# Patient Record
Sex: Female | Born: 1970 | Race: White | Hispanic: No | Smoking: Never smoker
Health system: Southern US, Community
[De-identification: ages and names within clinical notes are randomized; demographics above are authoritative.]

## PROBLEM LIST (undated history)

## (undated) DIAGNOSIS — T7840XA Allergy, unspecified, initial encounter: Secondary | ICD-10-CM

## (undated) DIAGNOSIS — G43109 Migraine with aura, not intractable, without status migrainosus: Secondary | ICD-10-CM

## (undated) DIAGNOSIS — K219 Gastro-esophageal reflux disease without esophagitis: Secondary | ICD-10-CM

## (undated) DIAGNOSIS — E7800 Pure hypercholesterolemia, unspecified: Principal | ICD-10-CM

## (undated) DIAGNOSIS — Z8659 Personal history of other mental and behavioral disorders: Secondary | ICD-10-CM

## (undated) HISTORY — DX: Pure hypercholesterolemia, unspecified: E78.00

## (undated) HISTORY — DX: Migraine with aura, not intractable, without status migrainosus: G43.109

## (undated) HISTORY — PX: WISDOM TOOTH EXTRACTION: SHX21

## (undated) HISTORY — DX: Allergy, unspecified, initial encounter: T78.40XA

## (undated) HISTORY — DX: Personal history of other mental and behavioral disorders: Z86.59

## (undated) HISTORY — DX: Gastro-esophageal reflux disease without esophagitis: K21.9

---

## 1998-07-11 ENCOUNTER — Emergency Department (HOSPITAL_COMMUNITY): Admission: EM | Admit: 1998-07-11 | Discharge: 1998-07-11 | Payer: Self-pay | Admitting: Emergency Medicine

## 1998-07-11 ENCOUNTER — Encounter: Payer: Self-pay | Admitting: Emergency Medicine

## 2001-05-30 ENCOUNTER — Ambulatory Visit (HOSPITAL_COMMUNITY): Admission: RE | Admit: 2001-05-30 | Discharge: 2001-05-30 | Payer: Self-pay | Admitting: Obstetrics and Gynecology

## 2001-05-30 ENCOUNTER — Encounter: Payer: Self-pay | Admitting: Obstetrics and Gynecology

## 2001-06-14 HISTORY — PX: TUBAL LIGATION: SHX77

## 2001-08-21 ENCOUNTER — Inpatient Hospital Stay (HOSPITAL_COMMUNITY): Admission: AD | Admit: 2001-08-21 | Discharge: 2001-08-23 | Payer: Self-pay | Admitting: Obstetrics and Gynecology

## 2001-09-08 ENCOUNTER — Ambulatory Visit (HOSPITAL_COMMUNITY): Admission: RE | Admit: 2001-09-08 | Discharge: 2001-09-08 | Payer: Self-pay | Admitting: Internal Medicine

## 2001-09-22 ENCOUNTER — Other Ambulatory Visit: Admission: RE | Admit: 2001-09-22 | Discharge: 2001-09-22 | Payer: Self-pay | Admitting: Obstetrics and Gynecology

## 2004-03-06 ENCOUNTER — Other Ambulatory Visit: Admission: RE | Admit: 2004-03-06 | Discharge: 2004-03-06 | Payer: Self-pay | Admitting: Obstetrics and Gynecology

## 2004-05-19 ENCOUNTER — Ambulatory Visit: Payer: Self-pay | Admitting: Family Medicine

## 2005-04-12 ENCOUNTER — Ambulatory Visit: Payer: Self-pay | Admitting: Internal Medicine

## 2005-07-12 ENCOUNTER — Ambulatory Visit: Payer: Self-pay | Admitting: Internal Medicine

## 2006-03-07 ENCOUNTER — Ambulatory Visit: Payer: Self-pay | Admitting: Internal Medicine

## 2006-05-24 ENCOUNTER — Ambulatory Visit: Payer: Self-pay | Admitting: Internal Medicine

## 2007-01-27 ENCOUNTER — Ambulatory Visit: Payer: Self-pay | Admitting: Internal Medicine

## 2007-10-25 ENCOUNTER — Ambulatory Visit: Payer: Self-pay | Admitting: Internal Medicine

## 2007-10-25 DIAGNOSIS — Z8659 Personal history of other mental and behavioral disorders: Secondary | ICD-10-CM

## 2007-10-25 HISTORY — DX: Personal history of other mental and behavioral disorders: Z86.59

## 2007-10-26 LAB — CONVERTED CEMR LAB
Cholesterol: 189 mg/dL (ref 0–200)
HDL: 47.8 mg/dL (ref >39.0)
LDL Cholesterol: 131 mg/dL — ABNORMAL HIGH (ref 0–99)
Total CHOL/HDL Ratio: 4
Triglycerides: 52 mg/dL (ref 0–149)
VLDL: 10 mg/dL (ref 0–40)

## 2007-12-05 ENCOUNTER — Ambulatory Visit: Payer: Self-pay | Admitting: Internal Medicine

## 2008-08-14 ENCOUNTER — Emergency Department (HOSPITAL_COMMUNITY): Admission: EM | Admit: 2008-08-14 | Discharge: 2008-08-14 | Payer: Self-pay | Admitting: Emergency Medicine

## 2008-08-20 ENCOUNTER — Ambulatory Visit: Payer: Self-pay | Admitting: Internal Medicine

## 2009-02-24 ENCOUNTER — Ambulatory Visit: Payer: Self-pay | Admitting: Internal Medicine

## 2009-02-24 DIAGNOSIS — N390 Urinary tract infection, site not specified: Secondary | ICD-10-CM

## 2009-02-24 LAB — CONVERTED CEMR LAB
Bilirubin Urine: NEGATIVE
Blood in Urine, dipstick: NEGATIVE
Glucose, Urine, Semiquant: NEGATIVE
Nitrite: NEGATIVE
Protein, U semiquant: NEGATIVE
Specific Gravity, Urine: 1.025
Urobilinogen, UA: 0.2
pH: 5.5

## 2009-04-24 ENCOUNTER — Ambulatory Visit: Payer: Self-pay | Admitting: Family Medicine

## 2009-04-24 DIAGNOSIS — J069 Acute upper respiratory infection, unspecified: Secondary | ICD-10-CM | POA: Insufficient documentation

## 2009-04-29 ENCOUNTER — Telehealth: Payer: Self-pay | Admitting: Internal Medicine

## 2010-07-12 LAB — CONVERTED CEMR LAB
CK-MB: 0.9 ng/mL (ref 0.3–4.0)
Total CK: 91 units/L (ref 7–177)
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
## 2010-10-30 NOTE — Op Note (Signed)
[facility]  Patient:    Shelly Goodwin, Shelly Goodwin Visit Number: [id number] MRN: [id number]          Service Type: OBS Location: 910A 9116 01 Attending Physician:  Osborn Coho Dictated by:   Devoria Albe Edward Jolly, M.D. Proc. Date: 08/21/01 Admit Date:  08/21/2001                             Operative Report  PREOPERATIVE DIAGNOSES:       1. Intrauterine pregnancy 41+5 weeks.                               2. Nonreassuring fetal assessment.  POSTOPERATIVE DIAGNOSES:      1. Intrauterine gestation at 41+5 weeks.                               2. Nonreassuring fetal assessment.  PROCEDURE:                    Vacuum assisted vaginal delivery with repair of perineal and vulvar lacerations.  ANESTHESIA:                   Epidural, local with 1% lidocaine.  ESTIMATED BLOOD LOSS:         400 cc.  COMPLICATIONS:                None.  INDICATIONS FOR PROCEDURE:    The patient was a 40 year old gravida 1, para 10 Caucasian female admitted at 41+[redacted] weeks gestation on August 21, 2001 with a complaint of leakage of yellow stained fluid per vagina at 4:30 the morning of admission.  The patient also noted contractions.  When the patient arrived in maternity admissions the cervix was 3 cm dilated and the patient was found to be contracting every five to 10 minutes.  The fetal heart rate tracing was reactive and reassuring.  The patient was admitted and she was sent to labor and delivery where she was reexamined.  The cervix was found to be 4 cm dilated and 100% effaced with the vertex at the -1 station.  A four bag was noted and artificial rupture of membranes was performed and the fluid was noted to be clear and very minimally yellow tinged.  The patient continued to contract and progress throughout the active phase of labor with a normal labor curve.  She did receive an epidural for anesthesia and achieved complete cervical dilatation at 1400.  With pushing the fetal  heart rate tracing documented recurrent late decelerations with rebound tachycardia.  The fetus then went on to have a prolonged deceleration to approximately 80-90 which lasted three minutes.  A recommendation was made at this time to proceed with a vacuum assisted vaginal delivery after the risks and benefits were reviewed.  FINDINGS:                     A viable female was delivered at 1455 with Apgars of 8 at one minute and 9 at five minutes.  Terminal meconium and blood stained amniotic fluid were appreciated.  There was a partial third degree laceration of the perineum and a left labia majora laceration.  The cervix and vagina were without lacerations.  The placenta had a normal insertion of a three vessel cord and was not  meconium stained.  The cord pH was noted to be 7.23.  PROCEDURE:                    The patient was examined and the vertex was found to be in the occiput anterior position at the 3+ station.  The Foley catheter had been just removed a few minutes prior.  The MityVac was placed over the vertex and with one contraction the newborn was delivered without difficulty.  An attempt was made to do DeLee suctioning on the nares and mouth on the perineum.  The remainder of the infant was delivered without difficulty and the cord was doubly clamped and cut and carried over to the awaiting pediatricians.  Cord gas and cord blood were obtained and the placenta delivered spontaneously.  A repair of the perineal laceration was performed with 0 and 2-0 Vicryl after injection with 1% lidocaine.  The left labial laceration was bleeding and was therefore repaired with interrupted sutures of 3-0 Vicryl, again after injection of local 1% lidocaine.  The patient did receive Pitocin 20 units intravenously after delivery of the placenta.  There were no complications of the procedure.  The baby and the mother tolerated the procedure well.  All needle, instrument, and sponge counts were  correct. Dictated by:   Devoria Albe Edward Jolly, M.D. Attending Physician:  Osborn Coho DD:  08/21/01 TD:  08/22/01 Job: [id number]

## 2010-10-30 NOTE — Discharge Summary (Signed)
[facility]  Patient:    Shelly Goodwin, DIFATTA Visit Number: [id number] MRN: [id number]          Service Type: OBS Location: 910A 9116 01 Attending Physician:  Osborn Coho Dictated by:   Gerrit Friends. Aldona Bar, M.D. Admit Date:  08/21/2001 Discharge Date: 08/23/2001                             Discharge Summary  DISCHARGE DIAGNOSES:          1. Term pregnancy, delivered 7-pound 10-ounce                                  female infant with Apgars 8 and 9.                               2. Blood type O positive.  PROCEDURES:                   1. Vacuum extraction assisted delivery.                               2. Repair of second-degree tears.  SUMMARY:                      This 40 year old primigravida was admitted for induction because of postdates.  On the morning of August 21, 2001 she was 4 cm dilated with a vertex at -1 station.  Amniotomy was carried out with reduction of yellow-tinged fluid.  She had fairly good progression of her labor and with her second stage developed some late decelerations with some rebound tachycardia, because of this the delivery was assisted with the aid of a vacuum extractor.  She delivered a viable female infant with Apgars of 8 and 9 over a second-degree tear which was repaired without difficulty.  Her postpartum course was subsequently totally benign.  Discharge hemoglobin 11.2, white count 15,800, platelet count 129,000.  On the morning of August 23, 2001 she was ambulating well, tolerating a regular diet well, having normal bowel and bladder function and was afebrile, her breast-feeding was going well and she was desirous of discharge.  DISCHARGE MEDICATIONS:        Discharge medications include Motrin 400-600 mg every 4-6 hours as needed for discomfort, and she will continue on her prenatal vitamins - one a day.  FOLLOWUP:                     She will return to the office for followup in approximately 4 weeks  time.  DISCHARGE INSTRUCTIONS:       She was given a detailed instruction sheet at the time of discharge and understood all instructions well.                                As her rubella status was nonimmune, she will receive her rubella vaccine prior to discharge.  CONDITION ON DISCHARGE:       Improved. Dictated by:   Gerrit Friends. Aldona Bar, M.D. Attending Physician:  Osborn Coho DD:  08/23/01 TD:  08/25/01 Job: [id number]

## 2011-11-15 ENCOUNTER — Other Ambulatory Visit: Payer: Self-pay | Admitting: Obstetrics and Gynecology

## 2011-11-15 DIAGNOSIS — R928 Other abnormal and inconclusive findings on diagnostic imaging of breast: Secondary | ICD-10-CM

## 2011-11-19 ENCOUNTER — Ambulatory Visit
Admission: RE | Admit: 2011-11-19 | Discharge: 2011-11-19 | Disposition: A | Discharge status: Home / Self Care | Payer: 59 | Source: Ambulatory Visit | Attending: Obstetrics and Gynecology | Admitting: Obstetrics and Gynecology

## 2011-11-19 DIAGNOSIS — R928 Other abnormal and inconclusive findings on diagnostic imaging of breast: Secondary | ICD-10-CM

## 2012-12-14 ENCOUNTER — Ambulatory Visit: Payer: 59 | Admitting: Internal Medicine

## 2013-01-29 ENCOUNTER — Encounter: Payer: Self-pay | Admitting: Family

## 2013-01-29 ENCOUNTER — Ambulatory Visit (INDEPENDENT_AMBULATORY_CARE_PROVIDER_SITE_OTHER): Payer: 59 | Admitting: Family

## 2013-01-29 VITALS — BP 120/78 | HR 77 | Ht 64.75 in | Wt 155.0 lb

## 2013-01-29 DIAGNOSIS — Z79899 Other long term (current) drug therapy: Secondary | ICD-10-CM

## 2013-01-29 DIAGNOSIS — G43109 Migraine with aura, not intractable, without status migrainosus: Secondary | ICD-10-CM

## 2013-01-29 DIAGNOSIS — Z0000 Encounter for general adult medical examination without abnormal findings: Secondary | ICD-10-CM

## 2013-01-29 LAB — CBC WITH DIFFERENTIAL/PLATELET
Basophils Relative: 0.5 % (ref 0.0–3.0)
Eosinophils Relative: 1.8 % (ref 0.0–5.0)
HCT: 39.7 % (ref 36.0–46.0)
Lymphs Abs: 2.2 10*3/uL (ref 0.7–4.0)
MCHC: 34.4 g/dL (ref 30.0–36.0)
MCV: 92.4 fl (ref 78.0–100.0)
Monocytes Absolute: 0.5 10*3/uL (ref 0.1–1.0)
Neutro Abs: 4.3 10*3/uL (ref 1.4–7.7)
RBC: 4.3 Mil/uL (ref 3.87–5.11)
WBC: 7.2 10*3/uL (ref 4.5–10.5)

## 2013-01-29 LAB — LIPID PANEL
Cholesterol: 201 mg/dL — ABNORMAL HIGH (ref 0–200)
HDL: 49.5 mg/dL (ref >39.00)
Triglycerides: 45 mg/dL (ref 0.0–149.0)

## 2013-01-29 LAB — COMPREHENSIVE METABOLIC PANEL
AST: 14 U/L (ref 0–37)
Alkaline Phosphatase: 46 U/L (ref 39–117)
BUN: 10 mg/dL (ref 6–23)
Glucose, Bld: 82 mg/dL (ref 70–99)
Sodium: 137 mEq/L (ref 135–145)
Total Bilirubin: 0.7 mg/dL (ref 0.3–1.2)
Total Protein: 7.6 g/dL (ref 6.0–8.3)

## 2013-01-29 MED ORDER — AMITRIPTYLINE HCL 10 MG PO TABS: 10.0000 mg | ORAL_TABLET | Freq: Every day | ORAL | Status: DC

## 2013-01-29 NOTE — Patient Instructions (Addendum)
Migraine Headache A migraine headache is an intense, throbbing pain on one or both sides of your head. A migraine can last for 30 minutes to several hours. CAUSES  The exact cause of a migraine headache is not always known. However, a migraine may be caused when nerves in the brain become irritated and release chemicals that cause inflammation. This causes pain. SYMPTOMS  Pain on one or both sides of your head.  Pulsating or throbbing pain.  Severe pain that prevents daily activities.  Pain that is aggravated by any physical activity.  Nausea, vomiting, or both.  Dizziness.  Pain with exposure to bright lights, loud noises, or activity.  General sensitivity to bright lights, loud noises, or smells. Before you get a migraine, you may get warning signs that a migraine is coming (aura). An aura may include:  Seeing flashing lights.  Seeing bright spots, halos, or zig-zag lines.  Having tunnel vision or blurred vision.  Having feelings of numbness or tingling.  Having trouble talking.  Having muscle weakness. MIGRAINE TRIGGERS  Alcohol.  Smoking.  Stress.  Menstruation.  Aged cheeses.  Foods or drinks that contain nitrates, glutamate, aspartame, or tyramine.  Lack of sleep.  Chocolate.  Caffeine.  Hunger.  Physical exertion.  Fatigue.  Medicines used to treat chest pain (nitroglycerine), birth control pills, estrogen, and some blood pressure medicines. DIAGNOSIS  A migraine headache is often diagnosed based on:  Symptoms.  Physical examination.  A CT scan or MRI of your head. TREATMENT Medicines may be given for pain and nausea. Medicines can also be given to help prevent recurrent migraines.  HOME CARE INSTRUCTIONS  Only take over-the-counter or prescription medicines for pain or discomfort as directed by your caregiver. The use of long-term narcotics is not recommended.  Lie down in a dark, quiet room when you have a migraine.  Keep a journal  to find out what may trigger your migraine headaches. For example, write down:  What you eat and drink.  How much sleep you get.  Any change to your diet or medicines.  Limit alcohol consumption.  Quit smoking if you smoke.  Get 7 to 9 hours of sleep, or as recommended by your caregiver.  Limit stress.  Keep lights dim if bright lights bother you and make your migraines worse. SEEK IMMEDIATE MEDICAL CARE IF:   Your migraine becomes severe.  You have a fever.  You have a stiff neck.  You have vision loss.  You have muscular weakness or loss of muscle control.  You start losing your balance or have trouble walking.  You feel faint or pass out.  You have severe symptoms that are different from your first symptoms. MAKE SURE YOU:   Understand these instructions.  Will watch your condition.  Will get help right away if you are not doing well or get worse. Document Released: 05/31/2005 Document Revised: 08/23/2011 Document Reviewed: 05/21/2011 [employer] Patient Information 2014 [employer].  

## 2013-01-29 NOTE — Progress Notes (Signed)
Subjective:    Patient ID: Shelly Goodwin, female    DOB: 1970-06-15, 42 y.o.   MRN: [id number]  HPI 42 year old white female, nonsmoker, patient of Dr. Abelina Bachelor is in today with complaints of frequent headaches. She reports the headaches extending across her forehand occurring about 3-4 times per week. She reports seeing squiggly lines or floaters prior to the headaches beginning and it gradually worsened. Can take an Excedrin Migraine that has helped. Rates the headaches 8/10. No nausea or vomiting. No sensitivity to light or noise. Has had an increased amount of stress. Her brother died in 06/08/23 suddenly. Her husband has kidney cancer. In her mother's health is declining. She does not routinely exercise.   Review of Systems  Constitutional: Negative.   HENT: Negative for hearing loss, congestion and postnasal drip.   Eyes: Positive for visual disturbance.  Respiratory: Negative.   Cardiovascular: Negative.   Gastrointestinal: Negative.   Musculoskeletal: Negative.   Skin: Negative.   Neurological: Positive for headaches. Negative for dizziness, speech difficulty, weakness, light-headedness and numbness.  Hematological: Negative.   Psychiatric/Behavioral: Negative.  Negative for sleep disturbance.       Increased stress   History reviewed. No pertinent past medical history.  History   Social History  . Marital Status: Married    Spouse Name: N/A    Number of Children: N/A  . Years of Education: N/A   Occupational History  . Not on file.   Social History Main Topics  . Smoking status: Never Smoker   . Smokeless tobacco: Not on file  . Alcohol Use: No  . Drug Use: No  . Sexual Activity: Not on file   Other Topics Concern  . Not on file   Social History Narrative  . No narrative on file    Past Surgical History  Procedure Laterality Date  . Tubal ligation  2003  . Wisdom tooth extraction      Family History  Problem Relation Age of Onset  . Heart attack Mother    . Hypertension Mother   . Hyperlipidemia Mother   . Heart attack Father   . Heart disease Father   . Stroke Father   . Atrial fibrillation Father   . Diabetes Father   . Hyperlipidemia Father   . Hypertension Brother     Allergies  Allergen Reactions  . Sulfonamide Derivatives     REACTION: itiching    No current outpatient prescriptions on file prior to visit.   No current facility-administered medications on file prior to visit.    BP 120/78  Pulse 77  Ht 5' 4.75" (1.645 m)  Wt 155 lb (70.308 kg)  BMI 25.98 kg/m2  LMP 07/26/2014chart    Objective:   Physical Exam  Constitutional: She is oriented to person, place, and time. She appears well-developed and well-nourished.  HENT:  Right Ear: External ear normal.  Left Ear: External ear normal.  Nose: Nose normal.  Mouth/Throat: Oropharynx is clear and moist.  Eyes: Conjunctivae are normal. Pupils are equal, round, and reactive to light.  Neck: Normal range of motion. Neck supple. No thyromegaly present.  Cardiovascular: Normal rate, regular rhythm and normal heart sounds.   Pulmonary/Chest: Effort normal and breath sounds normal.  Abdominal: Soft. Bowel sounds are normal.  Musculoskeletal: Normal range of motion.  Neurological: She is alert and oriented to person, place, and time. She has normal reflexes. She displays no tremor and normal reflexes. No cranial nerve deficit. She exhibits normal muscle tone. Coordination and  gait normal.  Skin: Skin is warm and dry.  Psychiatric: She has a normal mood and affect.          Assessment & Plan:  Assessment: 1. Migraine headache 2. Stress reaction  Plan: Since the headaches are occurring rather frequently, amitriptyline 10 mg daily at bedtime. We'll consider increasing to 20 mg. Advised exercise daily. Recheck patient in one month for complete physical exam.

## 2013-03-02 ENCOUNTER — Encounter: Payer: Self-pay | Admitting: Family

## 2013-03-02 ENCOUNTER — Ambulatory Visit (INDEPENDENT_AMBULATORY_CARE_PROVIDER_SITE_OTHER): Payer: 59 | Admitting: Family

## 2013-03-02 VITALS — BP 110/70 | Temp 98.1°F | Ht 64.75 in | Wt 150.0 lb

## 2013-03-02 DIAGNOSIS — Z0000 Encounter for general adult medical examination without abnormal findings: Secondary | ICD-10-CM

## 2013-03-02 DIAGNOSIS — E7800 Pure hypercholesterolemia, unspecified: Secondary | ICD-10-CM | POA: Insufficient documentation

## 2013-03-02 DIAGNOSIS — G43909 Migraine, unspecified, not intractable, without status migrainosus: Secondary | ICD-10-CM

## 2013-03-02 DIAGNOSIS — G43109 Migraine with aura, not intractable, without status migrainosus: Secondary | ICD-10-CM

## 2013-03-02 HISTORY — DX: Migraine with aura, not intractable, without status migrainosus: G43.109

## 2013-03-02 HISTORY — DX: Pure hypercholesterolemia, unspecified: E78.00

## 2013-03-02 NOTE — Patient Instructions (Addendum)

## 2013-03-02 NOTE — Progress Notes (Signed)
Subjective:    Patient ID: Shelly Goodwin, female    DOB: [birth year], 42 y.o.   MRN: [id number]  HPI 42 year old white female, nonsmoker, patient of Dr. Cato Mulligan is in today for a routine physical examination for this healthy  Female. Reviewed all health maintenance protocols including mammography and reviewed appropriate screening labs. Her immunization history was reviewed as well as her current medications and allergies refills of her chronic medications were given and the plan for yearly health maintenance was discussed all orders and referrals were made as appropriate.   Review of Systems  Constitutional: Negative.   HENT: Negative.   Eyes: Negative.   Respiratory: Negative.   Cardiovascular: Negative.   Gastrointestinal: Negative.   Endocrine: Negative.   Genitourinary: Negative.   Musculoskeletal: Negative.   Skin: Negative.   Allergic/Immunologic: Negative.   Neurological: Negative.   Hematological: Negative.   Psychiatric/Behavioral: Negative.    History reviewed. No pertinent past medical history.  History   Social History  . Marital Status: Married    Spouse Name: N/A    Number of Children: N/A  . Years of Education: N/A   Occupational History  . Not on file.   Social History Main Topics  . Smoking status: Never Smoker   . Smokeless tobacco: Not on file  . Alcohol Use: No  . Drug Use: No  . Sexual Activity: Not on file   Other Topics Concern  . Not on file   Social History Narrative  . No narrative on file    Past Surgical History  Procedure Laterality Date  . Tubal ligation  2003  . Wisdom tooth extraction      Family History  Problem Relation Age of Onset  . Heart attack Mother   . Hypertension Mother   . Hyperlipidemia Mother   . Heart attack Father   . Heart disease Father   . Stroke Father   . Atrial fibrillation Father   . Diabetes Father   . Hyperlipidemia Father   . Hypertension Brother     Allergies  Allergen Reactions  .  Sulfonamide Derivatives     REACTION: itiching    Current Outpatient Prescriptions on File Prior to Visit  Medication Sig Dispense Refill  . amitriptyline (ELAVIL) 10 MG tablet Take 1 tablet (10 mg total) by mouth at bedtime.  30 tablet  2   No current facility-administered medications on file prior to visit.    BP 110/70  Temp(Src) 98.1 F (36.7 C) (Oral)  Ht 5' 4.75" (1.645 m)  Wt 150 lb (68.04 kg)  BMI 25.14 kg/m2  LMP 08/21/2014chart    Objective:   Physical Exam  Constitutional: She is oriented to person, place, and time. She appears well-developed and well-nourished.  HENT:  Head: Normocephalic and atraumatic.  Right Ear: External ear normal.  Left Ear: External ear normal.  Nose: Nose normal.  Mouth/Throat: Oropharynx is clear and moist.  Eyes: Conjunctivae and EOM are normal. Pupils are equal, round, and reactive to light.  Neck: Normal range of motion. Neck supple.  Cardiovascular: Normal rate, regular rhythm and normal heart sounds.   Pulmonary/Chest: Effort normal and breath sounds normal.  Abdominal: Soft. Bowel sounds are normal.  Genitourinary:  Deferred to GYN  Musculoskeletal: Normal range of motion.  Neurological: She is alert and oriented to person, place, and time. She has normal reflexes. No cranial nerve deficit.  Skin: Skin is warm and dry.  Psychiatric: She has a normal mood and affect.  Assessment & Plan:  Assessment: 1. Complete physical exam 2. Migraine headaches 3. Hypercholesterolemia  Plan: Continue amitriptyline 10 mg every other day. Her sodium diet, exercise, monthly self breast exams. Low-cholesterol diet. Exercise daily. Followup in 6 months to recheck her cholesterol and sooner as needed.

## 2013-08-31 ENCOUNTER — Ambulatory Visit (INDEPENDENT_AMBULATORY_CARE_PROVIDER_SITE_OTHER): Payer: 59 | Admitting: Family

## 2013-08-31 ENCOUNTER — Encounter: Payer: Self-pay | Admitting: Family

## 2013-08-31 VITALS — BP 114/82 | HR 74 | Wt 155.0 lb

## 2013-08-31 DIAGNOSIS — G43109 Migraine with aura, not intractable, without status migrainosus: Secondary | ICD-10-CM

## 2013-08-31 DIAGNOSIS — E7800 Pure hypercholesterolemia, unspecified: Secondary | ICD-10-CM

## 2013-08-31 MED ORDER — AMITRIPTYLINE HCL 10 MG PO TABS: 10.0000 mg | ORAL_TABLET | Freq: Every day | ORAL | Status: DC

## 2013-08-31 NOTE — Patient Instructions (Signed)
Fat and Cholesterol Control Diet  Fat and cholesterol levels in your blood and organs are influenced by your diet. High levels of fat and cholesterol may lead to diseases of the heart, small and large blood vessels, gallbladder, liver, and pancreas.  CONTROLLING FAT AND CHOLESTEROL WITH DIET  Although exercise and lifestyle factors are important, your diet is key. That is because certain foods are known to raise cholesterol and others to lower it. The goal is to balance foods for their effect on cholesterol and more importantly, to replace saturated and trans fat with other types of fat, such as monounsaturated fat, polyunsaturated fat, and omega-3 fatty acids.  On average, a person should consume no more than 15 to 17 g of saturated fat daily. Saturated and trans fats are considered "bad" fats, and they will raise LDL cholesterol. Saturated fats are primarily found in animal products such as meats, butter, and cream. However, that does not mean you need to give up all your favorite foods. Today, there are good tasting, low-fat, low-cholesterol substitutes for most of the things you like to eat. Choose low-fat or nonfat alternatives. Choose round or loin cuts of red meat. These types of cuts are lowest in fat and cholesterol. Chicken (without the skin), fish, veal, and ground turkey breast are great choices. Eliminate fatty meats, such as hot dogs and salami. Even shellfish have little or no saturated fat. Have a 3 oz (85 g) portion when you eat lean meat, poultry, or fish.  Trans fats are also called "partially hydrogenated oils." They are oils that have been scientifically manipulated so that they are solid at room temperature resulting in a longer shelf life and improved taste and texture of foods in which they are added. Trans fats are found in stick margarine, some tub margarines, cookies, crackers, and baked goods.   When baking and cooking, oils are a great substitute for butter. The monounsaturated oils are  especially beneficial since it is believed they lower LDL and raise HDL. The oils you should avoid entirely are saturated tropical oils, such as coconut and palm.   Remember to eat a lot from food groups that are naturally free of saturated and trans fat, including fish, fruit, vegetables, beans, grains (barley, rice, couscous, bulgur wheat), and pasta (without cream sauces).   IDENTIFYING FOODS THAT LOWER FAT AND CHOLESTEROL   Soluble fiber may lower your cholesterol. This type of fiber is found in fruits such as apples, vegetables such as broccoli, potatoes, and carrots, legumes such as beans, peas, and lentils, and grains such as barley. Foods fortified with plant sterols (phytosterol) may also lower cholesterol. You should eat at least 2 g per day of these foods for a cholesterol lowering effect.   Read package labels to identify low-saturated fats, trans fat free, and low-fat foods at the supermarket. Select cheeses that have only 2 to 3 g saturated fat per ounce. Use a heart-healthy tub margarine that is free of trans fats or partially hydrogenated oil. When buying baked goods (cookies, crackers), avoid partially hydrogenated oils. Breads and muffins should be made from whole grains (whole-wheat or whole oat flour, instead of "flour" or "enriched flour"). Buy non-creamy canned soups with reduced salt and no added fats.   FOOD PREPARATION TECHNIQUES   Never deep-fry. If you must fry, either stir-fry, which uses very little fat, or use non-stick cooking sprays. When possible, broil, bake, or roast meats, and steam vegetables. Instead of putting butter or margarine on vegetables, use lemon   and herbs, applesauce, and cinnamon (for squash and sweet potatoes). Use nonfat yogurt, salsa, and low-fat dressings for salads.   LOW-SATURATED FAT / LOW-FAT FOOD SUBSTITUTES  Meats / Saturated Fat (g)  · Avoid: Steak, marbled (3 oz/85 g) / 11 g  · Choose: Steak, lean (3 oz/85 g) / 4 g  · Avoid: Hamburger (3 oz/85 g) / 7  g  · Choose: Hamburger, lean (3 oz/85 g) / 5 g  · Avoid: Ham (3 oz/85 g) / 6 g  · Choose: Ham, lean cut (3 oz/85 g) / 2.4 g  · Avoid: Chicken, with skin, dark meat (3 oz/85 g) / 4 g  · Choose: Chicken, skin removed, dark meat (3 oz/85 g) / 2 g  · Avoid: Chicken, with skin, light meat (3 oz/85 g) / 2.5 g  · Choose: Chicken, skin removed, light meat (3 oz/85 g) / 1 g  Dairy / Saturated Fat (g)  · Avoid: Whole milk (1 cup) / 5 g  · Choose: Low-fat milk, 2% (1 cup) / 3 g  · Choose: Low-fat milk, 1% (1 cup) / 1.5 g  · Choose: Skim milk (1 cup) / 0.3 g  · Avoid: Hard cheese (1 oz/28 g) / 6 g  · Choose: Skim milk cheese (1 oz/28 g) / 2 to 3 g  · Avoid: Cottage cheese, 4% fat (1 cup) / 6.5 g  · Choose: Low-fat cottage cheese, 1% fat (1 cup) / 1.5 g  · Avoid: Ice cream (1 cup) / 9 g  · Choose: Sherbet (1 cup) / 2.5 g  · Choose: Nonfat frozen yogurt (1 cup) / 0.3 g  · Choose: Frozen fruit bar / trace  · Avoid: Whipped cream (1 tbs) / 3.5 g  · Choose: Nondairy whipped topping (1 tbs) / 1 g  Condiments / Saturated Fat (g)  · Avoid: Mayonnaise (1 tbs) / 2 g  · Choose: Low-fat mayonnaise (1 tbs) / 1 g  · Avoid: Butter (1 tbs) / 7 g  · Choose: Extra light margarine (1 tbs) / 1 g  · Avoid: Coconut oil (1 tbs) / 11.8 g  · Choose: Olive oil (1 tbs) / 1.8 g  · Choose: Corn oil (1 tbs) / 1.7 g  · Choose: Safflower oil (1 tbs) / 1.2 g  · Choose: Sunflower oil (1 tbs) / 1.4 g  · Choose: Soybean oil (1 tbs) / 2.4 g  · Choose: Canola oil (1 tbs) / 1 g  Document Released: 05/31/2005 Document Revised: 09/25/2012 Document Reviewed: 11/19/2010  ExitCare® Patient Information ©2014 [employer].

## 2013-08-31 NOTE — Progress Notes (Signed)
Pre visit review using our clinic review tool, if applicable. No additional management support is needed unless otherwise documented below in the visit note. 

## 2013-08-31 NOTE — Progress Notes (Signed)
   Subjective:    Patient ID: Shelly Goodwin, female    DOB: 1971/03/11, 43 y.o.   MRN: [id number]  HPI 43 year old white female, nonsmoker is in today for recheck of hypercholesterolemia and migraine headaches. She's currently on amitriptyline 10 mg once daily at bedtime that helps to prevent migraine headaches. She has gained 5 pounds, not currently exercising. Has been trying to watch her diet to reduce her cholesterol. She's not fasting today.   Review of Systems  Constitutional: Negative.   HENT: Negative.   Respiratory: Negative.   Cardiovascular: Negative.   Gastrointestinal: Negative.   Genitourinary: Negative.   Musculoskeletal: Negative.   Neurological: Negative.    History reviewed. No pertinent past medical history.  History   Social History  . Marital Status: Married    Spouse Name: N/A    Number of Children: N/A  . Years of Education: N/A   Occupational History  . Not on file.   Social History Main Topics  . Smoking status: Never Smoker   . Smokeless tobacco: Not on file  . Alcohol Use: No  . Drug Use: No  . Sexual Activity: Not on file   Other Topics Concern  . Not on file   Social History Narrative  . No narrative on file    Past Surgical History  Procedure Laterality Date  . Tubal ligation  2003  . Wisdom tooth extraction      Family History  Problem Relation Age of Onset  . Heart attack Mother   . Hypertension Mother   . Hyperlipidemia Mother   . Heart attack Father   . Heart disease Father   . Stroke Father   . Atrial fibrillation Father   . Diabetes Father   . Hyperlipidemia Father   . Hypertension Brother     Allergies  Allergen Reactions  . Sulfonamide Derivatives     REACTION: itiching    No current outpatient prescriptions on file prior to visit.   No current facility-administered medications on file prior to visit.    BP 114/82  Pulse 74  Wt 155 lb (70.308 kg)  SpO2 99%chart    Objective:   Physical Exam    Constitutional: She is oriented to person, place, and time. She appears well-developed and well-nourished.  HENT:  Right Ear: External ear normal.  Left Ear: External ear normal.  Nose: Nose normal.  Mouth/Throat: Oropharynx is clear and moist.  Neck: Normal range of motion. Neck supple.  Cardiovascular: Normal rate, regular rhythm and normal heart sounds.   Pulmonary/Chest: Effort normal and breath sounds normal.  Abdominal: Soft. Bowel sounds are normal.  Musculoskeletal: Normal range of motion.  Neurological: She is alert and oriented to person, place, and time.  Skin: Skin is warm and dry.  Psychiatric: She has a normal mood and affect.          Assessment & Plan:  Shelly Goodwin was seen today for follow-up.  Diagnoses and associated orders for this visit:  Migraine with aura, without mention of intractable migraine without mention of status migrainosus  Pure hypercholesterolemia  Other Orders - amitriptyline (ELAVIL) 10 MG tablet; Take 1 tablet (10 mg total) by mouth at bedtime.     Patient will return and have fasting lipids drawn next week. Continue amitriptyline. Recheck in 6 months for complete physical exam.

## 2013-09-19 ENCOUNTER — Encounter: Payer: Self-pay | Admitting: Family

## 2014-01-03 LAB — HM PAP SMEAR

## 2014-03-05 ENCOUNTER — Ambulatory Visit (INDEPENDENT_AMBULATORY_CARE_PROVIDER_SITE_OTHER): Payer: 59 | Admitting: Family

## 2014-03-05 ENCOUNTER — Encounter: Payer: Self-pay | Admitting: Family

## 2014-03-05 VITALS — BP 98/60 | HR 70 | Ht 65.0 in | Wt 151.9 lb

## 2014-03-05 DIAGNOSIS — Z23 Encounter for immunization: Secondary | ICD-10-CM

## 2014-03-05 DIAGNOSIS — Z0000 Encounter for general adult medical examination without abnormal findings: Secondary | ICD-10-CM

## 2014-03-05 DIAGNOSIS — G43009 Migraine without aura, not intractable, without status migrainosus: Secondary | ICD-10-CM

## 2014-03-05 MED ORDER — AMITRIPTYLINE HCL 10 MG PO TABS: 10.0000 mg | ORAL_TABLET | Freq: Every day | ORAL | Status: DC

## 2014-03-05 NOTE — Patient Instructions (Signed)
Exercise to Stay Healthy Exercise helps you become and stay healthy. EXERCISE IDEAS AND TIPS Choose exercises that:  You enjoy.  Fit into your day. You do not need to exercise really hard to be healthy. You can do exercises at a slow or medium level and stay healthy. You can:  Stretch before and after working out.  Try yoga, Pilates, or tai chi.  Lift weights.  Walk fast, swim, jog, run, climb stairs, bicycle, dance, or rollerskate.  Take aerobic classes. Exercises that burn about 150 calories:  Running 1  miles in 15 minutes.  Playing volleyball for 45 to 60 minutes.  Washing and waxing a car for 45 to 60 minutes.  Playing touch football for 45 minutes.  Walking 1  miles in 35 minutes.  Pushing a stroller 1  miles in 30 minutes.  Playing basketball for 30 minutes.  Raking leaves for 30 minutes.  Bicycling 5 miles in 30 minutes.  Walking 2 miles in 30 minutes.  Dancing for 30 minutes.  Shoveling snow for 15 minutes.  Swimming laps for 20 minutes.  Walking up stairs for 15 minutes.  Bicycling 4 miles in 15 minutes.  Gardening for 30 to 45 minutes.  Jumping rope for 15 minutes.  Washing windows or floors for 45 to 60 minutes. Document Released: 07/03/2010 Document Revised: 08/23/2011 Document Reviewed: 07/03/2010 ExitCare Patient Information 2015 [employer]. This information is not intended to replace advice given to you by your health care provider. Make sure you discuss any questions you have with your health care provider.  

## 2014-03-05 NOTE — Progress Notes (Signed)
Pre visit review using our clinic review tool, if applicable. No additional management support is needed unless otherwise documented below in the visit note. 

## 2014-03-05 NOTE — Progress Notes (Signed)
Subjective:    Patient ID: Shelly Goodwin, female    DOB: 1971-01-10, 43 y.o.   MRN: [id number]  HPI 43 year old white female, nonsmoker, is in today for a CPX. Has a history of Migraine headaches. Tolerating Elavil well. Denies any concerns today. Sees gynecology for female care.   Review of Systems  Constitutional: Negative.   HENT: Negative.   Eyes: Negative.   Respiratory: Negative.   Cardiovascular: Negative.   Gastrointestinal: Negative.   Endocrine: Negative.   Genitourinary: Negative.   Musculoskeletal: Negative.   Skin: Negative.   Allergic/Immunologic: Negative.   Neurological: Negative.   Hematological: Negative.   Psychiatric/Behavioral: Negative.    History reviewed. No pertinent past medical history.  History   Social History  . Marital Status: Married    Spouse Name: N/A    Number of Children: N/A  . Years of Education: N/A   Occupational History  . Not on file.   Social History Main Topics  . Smoking status: Never Smoker   . Smokeless tobacco: Not on file  . Alcohol Use: No  . Drug Use: No  . Sexual Activity: Not on file   Other Topics Concern  . Not on file   Social History Narrative  . No narrative on file    Past Surgical History  Procedure Laterality Date  . Tubal ligation  2003  . Wisdom tooth extraction      Family History  Problem Relation Age of Onset  . Heart attack Mother   . Hypertension Mother   . Hyperlipidemia Mother   . Heart attack Father   . Heart disease Father   . Stroke Father   . Atrial fibrillation Father   . Diabetes Father   . Hyperlipidemia Father   . Hypertension Brother     Allergies  Allergen Reactions  . Sulfonamide Derivatives     REACTION: itiching    No current outpatient prescriptions on file prior to visit.   No current facility-administered medications on file prior to visit.    BP 98/60  Pulse 70  Ht 5\' 5"  (1.651 m)  Wt 151 lb 14.4 oz (68.901 kg)  BMI 25.28 kg/m2chart      Objective:   Physical Exam  Constitutional: She is oriented to person, place, and time. She appears well-developed and well-nourished.  HENT:  Head: Normocephalic.  Right Ear: External ear normal.  Left Ear: External ear normal.  Nose: Nose normal.  Mouth/Throat: Oropharynx is clear and moist.  Eyes: Conjunctivae and EOM are normal. Pupils are equal, round, and reactive to light.  Neck: Normal range of motion. Neck supple. No thyromegaly present.  Cardiovascular: Normal rate, regular rhythm and normal heart sounds.   Pulmonary/Chest: Effort normal and breath sounds normal.  Abdominal: Soft. Bowel sounds are normal.  Musculoskeletal: Normal range of motion. She exhibits no edema and no tenderness.  Neurological: She is alert and oriented to person, place, and time. She has normal reflexes. She displays normal reflexes. No cranial nerve deficit. Coordination normal.  Skin: Skin is warm and dry.  Psychiatric: She has a normal mood and affect.          Assessment & Plan:  Niyah was seen today for annual exam.  Diagnoses and associated orders for this visit:  Preventative health care - CMP; Future - CBC with Differential; Future - POC Urinalysis Dipstick - Lipid Panel; Future - TSH; Future  Need for prophylactic vaccination with combined diphtheria-tetanus-pertussis (DTP) vaccine - Tdap vaccine greater  than or equal to 7yo IM  Migraine without aura and without status migrainosus, not intractable  Other Orders - amitriptyline (ELAVIL) 10 MG tablet; Take 1 tablet (10 mg total) by mouth at bedtime.    call the office with any questions or concerns here recheck in one year and sooner as needed. Monthly self breast exams. Follow up with gynecology as scheduled.

## 2014-03-08 ENCOUNTER — Other Ambulatory Visit: Payer: 59

## 2014-03-14 ENCOUNTER — Other Ambulatory Visit (INDEPENDENT_AMBULATORY_CARE_PROVIDER_SITE_OTHER): Payer: 59

## 2014-03-14 DIAGNOSIS — Z0000 Encounter for general adult medical examination without abnormal findings: Secondary | ICD-10-CM

## 2014-03-14 LAB — CBC WITH DIFFERENTIAL/PLATELET
BASOS ABS: 0 10*3/uL (ref 0.0–0.1)
Basophils Relative: 0.4 % (ref 0.0–3.0)
Eosinophils Absolute: 0.2 10*3/uL (ref 0.0–0.7)
Eosinophils Relative: 2.5 % (ref 0.0–5.0)
HCT: 38 % (ref 36.0–46.0)
HEMOGLOBIN: 12.8 g/dL (ref 12.0–15.0)
LYMPHS PCT: 30.9 % (ref 12.0–46.0)
Lymphs Abs: 2.4 10*3/uL (ref 0.7–4.0)
MCHC: 33.6 g/dL (ref 30.0–36.0)
MCV: 92.8 fl (ref 78.0–100.0)
MONOS PCT: 7.4 % (ref 3.0–12.0)
Monocytes Absolute: 0.6 10*3/uL (ref 0.1–1.0)
NEUTROS ABS: 4.5 10*3/uL (ref 1.4–7.7)
Neutrophils Relative %: 58.8 % (ref 43.0–77.0)
Platelets: 156 10*3/uL (ref 150.0–400.0)
RBC: 4.09 Mil/uL (ref 3.87–5.11)
RDW: 14 % (ref 11.5–15.5)
WBC: 7.6 10*3/uL (ref 4.0–10.5)

## 2014-03-14 LAB — LIPID PANEL
CHOLESTEROL: 194 mg/dL (ref 0–200)
HDL: 57 mg/dL (ref >39.00)
LDL Cholesterol: 126 mg/dL — ABNORMAL HIGH (ref 0–99)
NONHDL: 137
Total CHOL/HDL Ratio: 3
Triglycerides: 57 mg/dL (ref 0.0–149.0)
VLDL: 11.4 mg/dL (ref 0.0–40.0)

## 2014-03-14 LAB — COMPREHENSIVE METABOLIC PANEL
ALT: 15 U/L (ref 0–35)
AST: 19 U/L (ref 0–37)
Albumin: 4.1 g/dL (ref 3.5–5.2)
Alkaline Phosphatase: 46 U/L (ref 39–117)
BUN: 14 mg/dL (ref 6–23)
CALCIUM: 9.5 mg/dL (ref 8.4–10.5)
CO2: 26 meq/L (ref 19–32)
CREATININE: 0.8 mg/dL (ref 0.4–1.2)
Chloride: 106 mEq/L (ref 96–112)
GFR: 83.87 mL/min (ref >60.00)
GLUCOSE: 75 mg/dL (ref 70–99)
Potassium: 4.5 mEq/L (ref 3.5–5.1)
Sodium: 137 mEq/L (ref 135–145)
Total Bilirubin: 0.7 mg/dL (ref 0.2–1.2)
Total Protein: 7.5 g/dL (ref 6.0–8.3)

## 2014-03-14 LAB — POCT URINALYSIS DIPSTICK
BILIRUBIN UA: NEGATIVE
GLUCOSE UA: NEGATIVE
Ketones, UA: NEGATIVE
Nitrite, UA: NEGATIVE
Protein, UA: NEGATIVE
RBC UA: NEGATIVE
SPEC GRAV UA: 1.01
Urobilinogen, UA: 0.2
pH, UA: 7

## 2014-03-14 LAB — TSH: TSH: 0.8 u[IU]/mL (ref 0.35–4.50)

## 2014-03-29 ENCOUNTER — Other Ambulatory Visit: Payer: Self-pay

## 2015-01-14 ENCOUNTER — Ambulatory Visit: Payer: Self-pay | Admitting: Family Medicine

## 2015-01-22 ENCOUNTER — Ambulatory Visit (INDEPENDENT_AMBULATORY_CARE_PROVIDER_SITE_OTHER): Payer: Commercial Managed Care - HMO | Admitting: Family Medicine

## 2015-01-22 ENCOUNTER — Encounter: Payer: Self-pay | Admitting: Family Medicine

## 2015-01-22 VITALS — BP 122/70 | HR 67 | Temp 98.3°F | Wt 155.0 lb

## 2015-01-22 DIAGNOSIS — G43109 Migraine with aura, not intractable, without status migrainosus: Secondary | ICD-10-CM

## 2015-01-22 DIAGNOSIS — Z8249 Family history of ischemic heart disease and other diseases of the circulatory system: Secondary | ICD-10-CM | POA: Diagnosis not present

## 2015-01-22 DIAGNOSIS — E7800 Pure hypercholesterolemia, unspecified: Secondary | ICD-10-CM

## 2015-01-22 NOTE — Patient Instructions (Signed)
Medication Instructions:  None, no additions advised  Other Instructions:  Have GYN send [place] a copy of visit and pap smear  We want to be proactive with protecting your heart. Strengthening the heart through cardio is a great idea for anyone especially for you with family history. Goal would be 150 minutes a week exercise. If you can do 30 minutes 3x a week that would be a great start.   Labwork: Next visit  Follow-Up: 6-12 months for morning office visit, come in fasting and update labs

## 2015-01-22 NOTE — Assessment & Plan Note (Signed)
S: 0.7% 10 year risk based off lipids due to protective HDL. LDL still above goal <100 A/P: we discussed adding exercise, working on moderating weight to 145-150 range. ASA at age 44. Start statin if risk >5%.

## 2015-01-22 NOTE — Assessment & Plan Note (Signed)
S:Very strong history- dad MI 30 (smoker), brother 60, mom 3 (nonsmoker) A/P: discussed being proactive and pushing for cardiac health with aerobic exercise goal 150 minutes a week but start with pushing for 90-100 minutes a week. We discussed also starting aspirin at age 44. For statin, use 5% 10 year risk cut off.

## 2015-01-22 NOTE — Progress Notes (Signed)
[name], MD Phone: [phone]  Subjective:  Patient presents today to establish care with me as their new primary care provider. Patient was formerly a patient of Dr. Engineer, water. Chief complaint-noted.   See problem oriented charting ROS-Denies any CP,  SOB, blurry vision, LE edema  The following were reviewed and entered/updated in epic: Past Medical History  Diagnosis Date  . Pure hypercholesterolemia 03/02/2013    No rx needed. High HDL. LDL <130.  Lab Results  Component Value Date   CHOL 194 03/14/2014   HDL 57.00 03/14/2014   LDLCALC 126* 03/14/2014   LDLDIRECT 142.8 01/29/2013   TRIG 57.0 03/14/2014   CHOLHDL 3 03/14/2014       . Migraine with aura 03/02/2013    Stress as trigger. Floaters beforehand. Minimal Nausea. Usually treats with tylenol or nsaid and improves if catches early. 1x a month.   amitriptyline previously at 10mg  made her too sleepy   . History of depression 10/25/2007    2005 husband diagnosed with kidney cancer (deals with metastasis issues still). Short term zoloft low dose.      Patient Active Problem List   Diagnosis Date Noted  . Family history of MI (myocardial infarction) 01/22/2015    Priority: High  . Migraine with aura 03/02/2013    Priority: Medium  . Pure hypercholesterolemia 03/02/2013    Priority: Medium  . History of depression 10/25/2007    Priority: Medium   Past Surgical History  Procedure Laterality Date  . Tubal ligation  2003  . Wisdom tooth extraction      Family History  Problem Relation Age of Onset  . Heart attack Mother 41    stent, nonsmoker  . Hypertension Mother   . Hyperlipidemia Mother   . Heart attack Father 38    heavy smoker  . Stroke Father     53s  . Atrial fibrillation Father   . Diabetes Father   . Hyperlipidemia Father   . Hypertension Brother   . Heart attack Brother 54    sudden death-presumed MI    Medications- reviewed and updated No current outpatient prescriptions on file.    No current facility-administered medications for this visit.    Allergies-reviewed and updated Allergies  Allergen Reactions  . Sulfonamide Derivatives     REACTION: itiching    Social History   Social History  . Marital Status: Married    Spouse Name: N/A  . Number of Children: N/A  . Years of Education: N/A   Social History Main Topics  . Smoking status: Never Smoker   . Smokeless tobacco: None  . Alcohol Use: No  . Drug Use: No  . Sexual Activity: Not Asked   Other Topics Concern  . None   Social History Narrative   Married. 1 son- 79 in 2016.       Work as Management consultant at [employer]: reading, time with family    ROS--See HPI   Objective: BP 122/70 mmHg  Pulse 67  Temp(Src) 98.3 F (36.8 C)  Wt 155 lb (70.308 kg) Gen: NAD, resting comfortably CV: RRR no murmurs rubs or gallops Lungs: CTAB no crackles, wheeze, rhonchi Abdomen: soft/nontender/nondistended/normal bowel sounds. No rebound or guarding.  Ext: no edema, 2+ PT pulses Skin: warm, dry, no rash Neuro: grossly normal, moves all extremities, normal gait   Assessment/Plan:  Family history of MI (myocardial infarction) S:Very strong history- dad MI 53 (smoker), brother 36, mom 71 (nonsmoker) A/P: discussed  being proactive and pushing for cardiac health with aerobic exercise goal 150 minutes a week but start with pushing for 90-100 minutes a week. We discussed also starting aspirin at age 66. For statin, use 5% 10 year risk cut off.    Pure hypercholesterolemia S: 0.7% 10 year risk based off lipids due to protective HDL. LDL still above goal <100 A/P: we discussed adding exercise, working on moderating weight to 145-150 range. ASA at age 30. Start statin if risk >5%.    Migraine with aura S:Stress as trigger. Floaters beforehand. Minimal Nausea. Usually treats with tylenol or nsaid and improves if catches early. 1x a month. amitriptyline previously at 10mg  made her too sleepy A/P:  given once a month migraines relieved by tylenol or nsaids, do not think prophylaxis is indicated, will monitor though.    6-12 months f/u with fasting labs. Gets CPE at GYN  >50% of 30 minute office visit was spent on counseling (discussing heart risk, need for exercise, reason no prophylaxis migraine indicated) and coordination of care

## 2015-01-22 NOTE — Assessment & Plan Note (Signed)
S:Stress as trigger. Floaters beforehand. Minimal Nausea. Usually treats with tylenol or nsaid and improves if catches early. 1x a month. amitriptyline previously at 10mg  made her too sleepy A/P: given once a month migraines relieved by tylenol or nsaids, do not think prophylaxis is indicated, will monitor though.

## 2015-01-29 ENCOUNTER — Other Ambulatory Visit: Payer: Self-pay | Admitting: Obstetrics and Gynecology

## 2015-01-29 DIAGNOSIS — R928 Other abnormal and inconclusive findings on diagnostic imaging of breast: Secondary | ICD-10-CM

## 2015-02-04 ENCOUNTER — Ambulatory Visit
Admission: RE | Admit: 2015-02-04 | Discharge: 2015-02-04 | Disposition: A | Discharge status: Home / Self Care | Payer: Commercial Managed Care - HMO | Source: Ambulatory Visit | Attending: Obstetrics and Gynecology | Admitting: Obstetrics and Gynecology

## 2015-02-04 DIAGNOSIS — R928 Other abnormal and inconclusive findings on diagnostic imaging of breast: Secondary | ICD-10-CM

## 2015-02-04 LAB — HM MAMMOGRAPHY

## 2015-03-06 ENCOUNTER — Encounter: Payer: Self-pay | Admitting: Podiatry

## 2015-03-06 ENCOUNTER — Ambulatory Visit (INDEPENDENT_AMBULATORY_CARE_PROVIDER_SITE_OTHER): Payer: Commercial Managed Care - HMO | Admitting: Podiatry

## 2015-03-06 VITALS — BP 136/80 | HR 64 | Resp 12

## 2015-03-06 DIAGNOSIS — Q828 Other specified congenital malformations of skin: Secondary | ICD-10-CM | POA: Diagnosis not present

## 2015-03-06 DIAGNOSIS — B070 Plantar wart: Secondary | ICD-10-CM | POA: Diagnosis not present

## 2015-03-06 NOTE — Progress Notes (Signed)
   Subjective:    Patient ID: Shelly Goodwin, female    DOB: May 31, 1971, 44 y.o.   MRN: [id number]  HPI: She presents today with a chief complaint of a painful lesion subsecond metatarsophalangeal joint of the left foot. She states has been there for approximately a week as it hurts the worst. She states that she has noted off for quite some time. She denies any trauma or any foreign body.    Review of Systems  Skin: Positive for color change.  All other systems reviewed and are negative.      Objective:   Physical Exam: 44 year old white female in no apparent distress vital signs stable alert and oriented 3. Pulses are strongly palpable. Neurologic sensorium is intact or Sims Weinstein monofilament. Deep tendon reflexes intact bilateral muscle strength +5 over 5 dorsiflexion plantar flexors and inverters everters all into the musculature is intact. Orthopedic evaluation demonstrate solid joints and ankle range of motion without crepitation. Cutaneous evaluation demonstrates a solitary poor keratoma non-verrucoid lesion plantar aspect sub-second metatarsophalangeal joint left foot. Skin lines do not circumvent the lesion and there is no thrombosed capillaries noted. This appears to be a solitary poor keratoma.        Assessment & Plan:  Solitary poor keratoma left foot.  Plan: Sharp debridement today and then salicylic acid to be left on under occlusion for 3 days follow-up with me in 6 weeks.

## 2015-04-17 ENCOUNTER — Encounter: Payer: Self-pay | Admitting: Podiatry

## 2015-04-17 ENCOUNTER — Ambulatory Visit (INDEPENDENT_AMBULATORY_CARE_PROVIDER_SITE_OTHER): Payer: Commercial Managed Care - HMO | Admitting: Podiatry

## 2015-04-17 VITALS — BP 109/72 | HR 78 | Resp 12

## 2015-04-17 DIAGNOSIS — B070 Plantar wart: Secondary | ICD-10-CM

## 2015-04-17 NOTE — Progress Notes (Signed)
She presents today to follow-up for poor keratoma plantar aspect of the left foot. She states that it just seems to be getting worse.  Objective: Vital signs are stable she is alert and oriented 3. Reactive hyperkeratotic lesion overlying the plantar surface of the third metatarsal head upon debridement does demonstrate a well-circumscribed lesion with skin lines circumventing the lesion. No thrombosed capillaries are visible yet. This does resemble a wart.  Assessment: Verruca plantaris left foot.  Plan: Surgical curettage was performed today in the office after 2-1/2 mL of a 50-50 mixture of Marcaine plain lidocaine with epinephrine was injected sublesionally. She tolerated this procedure well. She was given both oral and written home-going instructions for care and soaking of her foot. We sent the lesion for pathologic evaluation and I will follow-up with her in 1 week for reevaluation.  Roselind Messier DPM

## 2015-04-17 NOTE — Patient Instructions (Signed)

## 2015-04-18 ENCOUNTER — Telehealth: Payer: Self-pay | Admitting: *Deleted

## 2015-04-18 NOTE — Telephone Encounter (Signed)
Left plantar skin wedge sent to [facility].

## 2015-04-24 ENCOUNTER — Encounter: Payer: Self-pay | Admitting: Podiatry

## 2015-04-24 ENCOUNTER — Ambulatory Visit (INDEPENDENT_AMBULATORY_CARE_PROVIDER_SITE_OTHER): Payer: Commercial Managed Care - HMO | Admitting: Podiatry

## 2015-04-24 VITALS — BP 126/74 | HR 67 | Resp 12

## 2015-04-24 DIAGNOSIS — B070 Plantar wart: Secondary | ICD-10-CM

## 2015-04-24 NOTE — Progress Notes (Signed)
She presents today for follow-up of a surgical curettage left foot. She denies fever chills nausea vomiting must lay some pain sensation she's been soaking it in antibacterial soap and warm water.  Objective: Vital signs are stable she is alert and oriented 3. Pulses are strongly palpable. Wound to the plantar aspect of the left foot appears to be granulating in very nicely from deep to superficial. It is contracting and healing well.  Assessment: Well-healing surgical foot left.  Plan: Covered in the day Levaquin at nighttime I encouraged her to continue to soak but in absence also warm water. We will notify her of her biopsy results.

## 2015-05-14 ENCOUNTER — Telehealth: Payer: Self-pay | Admitting: *Deleted

## 2015-05-14 NOTE — Telephone Encounter (Signed)
Left message informing pt Dr. Milinda Pointer reviewed biopsy of 04/17/2015.

## 2015-07-28 ENCOUNTER — Ambulatory Visit: Payer: Commercial Managed Care - HMO | Admitting: Family Medicine

## 2015-08-08 ENCOUNTER — Encounter: Payer: Self-pay | Admitting: Family Medicine

## 2015-08-08 ENCOUNTER — Ambulatory Visit (INDEPENDENT_AMBULATORY_CARE_PROVIDER_SITE_OTHER): Payer: Commercial Managed Care - HMO | Admitting: Family Medicine

## 2015-08-08 VITALS — BP 104/60 | HR 60 | Temp 98.8°F | Wt 152.0 lb

## 2015-08-08 DIAGNOSIS — J101 Influenza due to other identified influenza virus with other respiratory manifestations: Secondary | ICD-10-CM

## 2015-08-08 DIAGNOSIS — H5203 Hypermetropia, bilateral: Secondary | ICD-10-CM | POA: Diagnosis not present

## 2015-08-08 DIAGNOSIS — E7800 Pure hypercholesterolemia, unspecified: Secondary | ICD-10-CM | POA: Diagnosis not present

## 2015-08-08 LAB — COMPREHENSIVE METABOLIC PANEL
ALK PHOS: 47 U/L (ref 39–117)
ALT: 10 U/L (ref 0–35)
AST: 14 U/L (ref 0–37)
Albumin: 4.3 g/dL (ref 3.5–5.2)
BILIRUBIN TOTAL: 0.6 mg/dL (ref 0.2–1.2)
BUN: 10 mg/dL (ref 6–23)
CALCIUM: 9.9 mg/dL (ref 8.4–10.5)
CO2: 30 mEq/L (ref 19–32)
Chloride: 103 mEq/L (ref 96–112)
Creatinine, Ser: 0.76 mg/dL (ref 0.40–1.20)
GFR: 87.16 mL/min (ref >60.00)
Glucose, Bld: 89 mg/dL (ref 70–99)
POTASSIUM: 4.6 meq/L (ref 3.5–5.1)
Sodium: 138 mEq/L (ref 135–145)
TOTAL PROTEIN: 7.7 g/dL (ref 6.0–8.3)

## 2015-08-08 LAB — CBC WITH DIFFERENTIAL/PLATELET
BASOS ABS: 0 10*3/uL (ref 0.0–0.1)
Basophils Relative: 0.6 % (ref 0.0–3.0)
EOS PCT: 1.7 % (ref 0.0–5.0)
Eosinophils Absolute: 0.1 10*3/uL (ref 0.0–0.7)
HEMATOCRIT: 41.2 % (ref 36.0–46.0)
Hemoglobin: 13.9 g/dL (ref 12.0–15.0)
LYMPHS ABS: 2.1 10*3/uL (ref 0.7–4.0)
LYMPHS PCT: 40.5 % (ref 12.0–46.0)
MCHC: 33.7 g/dL (ref 30.0–36.0)
MCV: 91.7 fl (ref 78.0–100.0)
MONOS PCT: 8.7 % (ref 3.0–12.0)
Monocytes Absolute: 0.4 10*3/uL (ref 0.1–1.0)
NEUTROS ABS: 2.5 10*3/uL (ref 1.4–7.7)
Neutrophils Relative %: 48.5 % (ref 43.0–77.0)
Platelets: 222 10*3/uL (ref 150.0–400.0)
RBC: 4.49 Mil/uL (ref 3.87–5.11)
RDW: 13.4 % (ref 11.5–15.5)
WBC: 5.1 10*3/uL (ref 4.0–10.5)

## 2015-08-08 LAB — LIPID PANEL
CHOLESTEROL: 192 mg/dL (ref 0–200)
HDL: 43.8 mg/dL (ref >39.00)
LDL Cholesterol: 133 mg/dL — ABNORMAL HIGH (ref 0–99)
NonHDL: 148.25
TRIGLYCERIDES: 77 mg/dL (ref 0.0–149.0)
Total CHOL/HDL Ratio: 4
VLDL: 15.4 mg/dL (ref 0.0–40.0)

## 2015-08-08 NOTE — Patient Instructions (Addendum)
We will call you within a week about your referral to optho. If you do not hear within 2 weeks, give [place] a call.   Labs before you go

## 2015-08-08 NOTE — Progress Notes (Signed)
Shelly Reddish, MD  Subjective:  Shelly Goodwin is a 45 y.o. year old very pleasant female patient who presents for/with See problem oriented charting ROS- recent fever, chills, nausea due to flu. No chest pain or shortness of breath.   Past Medical History-  Patient Active Problem List   Diagnosis Date Noted  . Family history of MI (myocardial infarction) 01/22/2015    Priority: High  . Migraine with aura 03/02/2013    Priority: Medium  . Pure hypercholesterolemia 03/02/2013    Priority: Medium  . History of depression 10/25/2007    Priority: Medium    Medications- reviewed and updated, tamiflu currently  Objective: BP 104/60 mmHg  Pulse 60  Temp(Src) 98.8 F (37.1 C)  Wt 152 lb (68.947 kg) Gen: NAD, resting comfortably TM normal in viewable portion, oropharynx normal, occasional cough CV: RRR no murmurs rubs or gallops Lungs: CTAB no crackles, wheeze, rhonchi Abdomen: soft/nontender/nondistended/normal bowel sounds. No rebound or guarding.  Ext: no edema Skin: warm, dry Neuro: CN II-XII intact, sensation and reflexes normal throughout, 5/5 muscle strength in bilateral upper and lower extremities. Normal finger to nose. Normal rapid alternating movements. No pronator drift. Normal romberg. Normal gait.   Assessment/Plan:  Pure hypercholesterolemia with family history of MI. Very strong history- dad MI 57 (smoker), brother 23, mom 9 (nonsmoker) S: Last 10 year risk 0.7%. Goal was to lose weight to 145-150 range. Patient has not really made progress other than weight loss from flu.  A/P: continue to work on [employer] exercise, working on moderating weight to 145-150 range. ASA at age 41. Start statin if risk >5%. Update lipids, cbc, cmet today.   Influenza A and lingering symptoms S:Wednesday last week. Fever 101, nausea, wiped out- then slightly improved then worse again on Saturday and Sunday with high fever again. Sunday got on Tamiflu. Tested positive for flu A. Wednesday  and Thursday started feeling dizzy. R neck tender and some fullness in left ear. Had flu shot.  A/P: Improving. Neuro exam reassuring from lightheadedness perspective- advised push fluids and return to care if persistent after 4-5 days or if worsens  Migraine with aura Very infrequent over last 6 months. Occasional tension headache.   Farsightedness- referred to optho per patient request  1 year follow up. Sees GYN for physicals. Return precautions advised.   Orders Placed This Encounter  Procedures  . CBC with Differential/Platelet  . Comprehensive metabolic panel    [place]    Order Specific Question:  Has the patient fasted?    Answer:  No  . Lipid panel    [place]    Order Specific Question:  Has the patient fasted?    Answer:  No  . Ambulatory referral to Ophthalmology    Referral Priority:  Routine    Referral Type:  Consultation    Referral Reason:  Specialty Services Required    Requested Specialty:  Ophthalmology    Number of Visits Requested:  1

## 2015-12-19 ENCOUNTER — Ambulatory Visit (INDEPENDENT_AMBULATORY_CARE_PROVIDER_SITE_OTHER): Payer: Commercial Managed Care - HMO | Admitting: Family Medicine

## 2015-12-19 ENCOUNTER — Encounter: Payer: Self-pay | Admitting: Family Medicine

## 2015-12-19 VITALS — BP 120/72 | HR 75 | Temp 98.2°F | Ht 65.0 in | Wt 155.0 lb

## 2015-12-19 DIAGNOSIS — R079 Chest pain, unspecified: Secondary | ICD-10-CM

## 2015-12-19 DIAGNOSIS — M25512 Pain in left shoulder: Secondary | ICD-10-CM

## 2015-12-19 DIAGNOSIS — Z8249 Family history of ischemic heart disease and other diseases of the circulatory system: Secondary | ICD-10-CM

## 2015-12-19 NOTE — Progress Notes (Signed)
Pre visit review using our clinic review tool, if applicable. No additional management support is needed unless otherwise documented below in the visit note. 

## 2015-12-19 NOTE — Progress Notes (Addendum)
Subjective:  Shelly Goodwin is a 45 y.o. year old very pleasant female patient who presents for/with See problem oriented charting ROS- with episodes no dizziness, diaphoresis, shortness of breath.see any ROS included in HPI as well.   Past Medical History-  Patient Active Problem List   Diagnosis Date Noted  . Family history of MI (myocardial infarction) 01/22/2015    Priority: High  . Migraine with aura 03/02/2013    Priority: Medium  . Pure hypercholesterolemia 03/02/2013    Priority: Medium  . History of depression 10/25/2007    Priority: Medium    Medications- reviewed and updated No current outpatient prescriptions on file.   No current facility-administered medications for this visit.    Objective: BP 120/72 mmHg  Pulse 75  Temp(Src) 98.2 F (36.8 C) (Oral)  Ht 5\' 5"  (1.651 m)  Wt 155 lb (70.308 kg)  BMI 25.79 kg/m2  SpO2 98%  LMP 12/15/2015 (Exact Date) Gen: NAD, resting comfortably CV: RRR no murmurs rubs or gallops No chest wall pain Lungs: CTAB no crackles, wheeze, rhonchi Abdomen: soft/nontender/nondistended/normal bowel sounds. No rebound or guarding.  Ext: no edema Skin: warm, dry Neuro: grossly normal, moves all extremities  Left Shoulder: Inspection reveals no abnormalities, atrophy or asymmetry. Palpation is normal with no tenderness over AC joint or bicipital groove. ROM is full in all planes. Rotator cuff strength normal throughout. No signs of impingement with negative Neer and Hawkin's tests, empty can. No painful arc and no drop arm sign.  EKG: sinus rhythm with rate of 68, normal axis, normal intervals, no hypertrophy, no st or t wave changes. Poor lead placement avL  Assessment/Plan:  Left shoulder pain- now more in left side of chest S:Sunday started having some pain near upper left shoulder that comes and goes. Feels like slight pinch <5/10 sometimes down into left arm with some tingling almost feels asleep. Today seems to be going more  to central chest. Mildly dizzy last was not having pain at that time- room felt like it was spinning slightly for short period. Once every other hour for a few minutes. Can just be sitting and has the issue- including right now while waiting.  A/P: nonexertional chest pain that I have a very low suspicion of cardiac disease. That being said- patient does have very strong family history with dad MI 45, brother 48, mother 76. For this reason will get stress test despite reassuring EKG- will do treadmill with nuclear study. May be anxiety- husband kidney cancer- fighting since 2005. has moved to bone and hurting in chest and has new spots 12/19/15 confirmed (day after symptoms started but she had been awaiting results). She is going to take an aspirin daily until we get results back but we are holding off on statin for now. Worsening symptoms will lead to ER visit   Orders Placed This Encounter  Procedures  . Myocardial Perfusion Imaging    Standing Status: Future     Number of Occurrences:      Standing Expiration Date: 12/18/2016    Order Specific Question:  Where should this test be performed    Answer:  [facility])    Order Specific Question:  Type of stress    Answer:  Exercise    Order Specific Question:  Patient weight in lbs    Answer:  155  . EKG 12-Lead    Order Specific Question:  Where should this test be performed    Answer:  Other  new acute condition. Uncertain prognosis. Medication management- adding aspirin 81mg   The duration of face-to-face time during this visit was 25 minutes. Greater than 50% of this time was spent in counseling, explanation of diagnosis, planning of further management, and/or coordination of care.    Return precautions advised.  [name], MD

## 2015-12-19 NOTE — Patient Instructions (Signed)
EKG looks great  We will call you within a week about your referral for nuclear stress test. If you do not hear within 2 weeks, give [place] a call.   If you have new or worsening symptoms seek care immediately

## 2015-12-29 ENCOUNTER — Telehealth (HOSPITAL_COMMUNITY): Payer: Self-pay | Admitting: *Deleted

## 2015-12-29 NOTE — Telephone Encounter (Signed)
Patient given detailed instructions per Myocardial Perfusion Study Information Sheet for the test on 01/01/16 at 7:45. Patient notified to arrive 15 minutes early and that it is imperative to arrive on time for appointment to keep from having the test rescheduled.  If you need to cancel or reschedule your appointment, please call the office within 24 hours of your appointment. Failure to do so may result in a cancellation of your appointment, and a $50 no show fee. Patient verbalized understanding.Shelly Goodwin

## 2015-12-29 NOTE — Telephone Encounter (Signed)
Left message on voicemail in reference to upcoming appointment scheduled for 01/01/16. Phone number given for a call back so details instructions can be given. [name], [name]

## 2016-01-01 ENCOUNTER — Ambulatory Visit (HOSPITAL_COMMUNITY): Payer: Commercial Managed Care - HMO | Attending: Cardiology

## 2016-01-01 DIAGNOSIS — R079 Chest pain, unspecified: Secondary | ICD-10-CM | POA: Insufficient documentation

## 2016-01-01 DIAGNOSIS — Z8249 Family history of ischemic heart disease and other diseases of the circulatory system: Secondary | ICD-10-CM | POA: Diagnosis present

## 2016-01-01 LAB — MYOCARDIAL PERFUSION IMAGING
CHL CUP NUCLEAR SDS: 3
CHL CUP RESTING HR STRESS: 67 {beats}/min
CHL RATE OF PERCEIVED EXERTION: 18
CSEPED: 9 min
CSEPEDS: 15 s
CSEPEW: 10.5 METS
CSEPHR: 91 %
LHR: 0.34
LV dias vol: 77 mL (ref 46–106)
LVSYSVOL: 17 mL
MPHR: 174 {beats}/min
Peak HR: 160 {beats}/min
SRS: 8
SSS: 11
TID: 1.21

## 2016-01-01 MED ORDER — TECHNETIUM TC 99M TETROFOSMIN IV KIT
10.3000 | PACK | Freq: Once | INTRAVENOUS | Status: AC | PRN
  Administered 2016-01-01: 10 via INTRAVENOUS
  Filled 2016-01-01: qty 10

## 2016-01-01 MED ORDER — TECHNETIUM TC 99M TETROFOSMIN IV KIT
31.9000 | PACK | Freq: Once | INTRAVENOUS | Status: AC | PRN
  Administered 2016-01-01: 31.9 via INTRAVENOUS
  Filled 2016-01-01: qty 32

## 2016-04-20 ENCOUNTER — Encounter: Payer: Self-pay | Admitting: Family Medicine

## 2016-04-28 ENCOUNTER — Encounter: Payer: Self-pay | Admitting: Family Medicine

## 2016-04-28 DIAGNOSIS — Z85828 Personal history of other malignant neoplasm of skin: Secondary | ICD-10-CM | POA: Insufficient documentation

## 2016-05-12 ENCOUNTER — Encounter: Payer: Self-pay | Admitting: Family Medicine

## 2016-06-01 ENCOUNTER — Other Ambulatory Visit: Payer: Self-pay | Admitting: Obstetrics and Gynecology

## 2016-06-01 DIAGNOSIS — R928 Other abnormal and inconclusive findings on diagnostic imaging of breast: Secondary | ICD-10-CM

## 2016-06-09 ENCOUNTER — Other Ambulatory Visit: Payer: Commercial Managed Care - HMO

## 2016-06-16 ENCOUNTER — Ambulatory Visit
Admission: RE | Admit: 2016-06-16 | Discharge: 2016-06-16 | Disposition: A | Discharge status: Home / Self Care | Payer: Commercial Managed Care - HMO | Source: Ambulatory Visit | Attending: Obstetrics and Gynecology | Admitting: Obstetrics and Gynecology

## 2016-06-16 DIAGNOSIS — R928 Other abnormal and inconclusive findings on diagnostic imaging of breast: Secondary | ICD-10-CM

## 2016-06-16 LAB — HM MAMMOGRAPHY

## 2017-01-18 ENCOUNTER — Encounter: Payer: Self-pay | Admitting: Family Medicine

## 2017-01-18 ENCOUNTER — Ambulatory Visit (INDEPENDENT_AMBULATORY_CARE_PROVIDER_SITE_OTHER): Payer: Commercial Managed Care - HMO | Admitting: Family Medicine

## 2017-01-18 VITALS — BP 118/82 | HR 73 | Temp 98.4°F | Ht 64.75 in | Wt 154.4 lb

## 2017-01-18 DIAGNOSIS — E7800 Pure hypercholesterolemia, unspecified: Secondary | ICD-10-CM

## 2017-01-18 DIAGNOSIS — Z79899 Other long term (current) drug therapy: Secondary | ICD-10-CM | POA: Diagnosis not present

## 2017-01-18 DIAGNOSIS — Z0000 Encounter for general adult medical examination without abnormal findings: Secondary | ICD-10-CM

## 2017-01-18 LAB — COMPREHENSIVE METABOLIC PANEL
ALK PHOS: 47 U/L (ref 39–117)
ALT: 9 U/L (ref 0–35)
AST: 14 U/L (ref 0–37)
Albumin: 4.3 g/dL (ref 3.5–5.2)
BILIRUBIN TOTAL: 0.6 mg/dL (ref 0.2–1.2)
BUN: 9 mg/dL (ref 6–23)
CALCIUM: 9.5 mg/dL (ref 8.4–10.5)
CO2: 28 meq/L (ref 19–32)
Chloride: 104 mEq/L (ref 96–112)
Creatinine, Ser: 0.73 mg/dL (ref 0.40–1.20)
GFR: 90.73 mL/min (ref >60.00)
Glucose, Bld: 82 mg/dL (ref 70–99)
POTASSIUM: 4.3 meq/L (ref 3.5–5.1)
Sodium: 139 mEq/L (ref 135–145)
TOTAL PROTEIN: 7.2 g/dL (ref 6.0–8.3)

## 2017-01-18 LAB — CBC
HEMATOCRIT: 41.8 % (ref 36.0–46.0)
HEMOGLOBIN: 13.9 g/dL (ref 12.0–15.0)
MCHC: 33.2 g/dL (ref 30.0–36.0)
MCV: 95.9 fl (ref 78.0–100.0)
PLATELETS: 180 10*3/uL (ref 150.0–400.0)
RBC: 4.35 Mil/uL (ref 3.87–5.11)
RDW: 13.6 % (ref 11.5–15.5)
WBC: 10.5 10*3/uL (ref 4.0–10.5)

## 2017-01-18 LAB — LIPID PANEL
Cholesterol: 193 mg/dL (ref 0–200)
HDL: 52.8 mg/dL (ref >39.00)
LDL Cholesterol: 127 mg/dL — ABNORMAL HIGH (ref 0–99)
NonHDL: 140.67
TRIGLYCERIDES: 68 mg/dL (ref 0.0–149.0)
Total CHOL/HDL Ratio: 4
VLDL: 13.6 mg/dL (ref 0.0–40.0)

## 2017-01-18 LAB — VITAMIN D 25 HYDROXY (VIT D DEFICIENCY, FRACTURES): VITD: 20.94 ng/mL — ABNORMAL LOW (ref 30.00–100.00)

## 2017-01-18 NOTE — Assessment & Plan Note (Signed)
Will update lipids and consider 5% cut off per ascvd calculator. Consider aspirin age 46.

## 2017-01-18 NOTE — Patient Instructions (Addendum)
Sign release of information at the check out desk for last pap smear from Dr. Philis Pique. If 2015 was last Pap then will likely need to be updated this year (unless they did HPV in which case sometimes they wait until 5 years)  Only change I would recommend is to try to incorporate 150 minutes of exercise per week  Please stop by lab before you go

## 2017-01-18 NOTE — Progress Notes (Addendum)
Phone: [phone]  Subjective:  Patient presents today for their annual physical. Chief complaint-noted.   See problem oriented charting- ROS- full  review of systems was completed and negative except for: grief/crying after loss of husband in October 2017. No chest pain or shortness of breath. No headache or blurry vision.   The following were reviewed and entered/updated in epic: Past Medical History:  Diagnosis Date  . History of depression 10/25/2007   2005 husband diagnosed with kidney cancer (deals with metastasis issues still). Short term zoloft low dose.     . Migraine with aura 03/02/2013   Stress as trigger. Floaters beforehand. Minimal Nausea. Usually treats with tylenol or nsaid and improves if catches early. 1x a month.   amitriptyline previously at 10mg  made her too sleepy   . Pure hypercholesterolemia 03/02/2013   No rx needed. High HDL. LDL <130.  Lab Results  Component Value Date   CHOL 194 03/14/2014   HDL 57.00 03/14/2014   LDLCALC 126* 03/14/2014   LDLDIRECT 142.8 01/29/2013   TRIG 57.0 03/14/2014   CHOLHDL 3 03/14/2014        Patient Active Problem List   Diagnosis Date Noted  . Family history of MI (myocardial infarction) 01/22/2015    Priority: High  . Migraine with aura 03/02/2013    Priority: Medium  . Pure hypercholesterolemia 03/02/2013    Priority: Medium  . History of depression 10/25/2007    Priority: Medium  . History of skin cancer 04/28/2016   Past Surgical History:  Procedure Laterality Date  . TUBAL LIGATION  2003  . WISDOM TOOTH EXTRACTION      Family History  Problem Relation Age of Onset  . Heart attack Mother 59       stent, nonsmoker  . Hypertension Mother   . Hyperlipidemia Mother   . Heart attack Father 48       heavy smoker  . Stroke Father        77s  . Atrial fibrillation Father   . Diabetes Father   . Hyperlipidemia Father   . Hypertension Brother   . Heart attack Brother 23       sudden death-presumed MI     Medications- reviewed and updated No current outpatient prescriptions on file.   No current facility-administered medications for this visit.     Allergies-reviewed and updated Allergies  Allergen Reactions  . Sulfonamide Derivatives     REACTION: itiching    Social History   Social History  . Marital status: Widowed    Spouse name: N/A  . Number of children: N/A  . Years of education: N/A   Social History Main Topics  . Smoking status: Never Smoker  . Smokeless tobacco: Never Used  . Alcohol use No  . Drug use: No  . Sexual activity: Not Asked   Other Topics Concern  . None   Social History Narrative   Widowed (husband passed kidney cancer oct 2017). 1 son- 51 in 2016.       Work as Management consultant at [employer]: reading, time with family    Objective: BP 118/82 (BP Location: Left Arm, Patient Position: Sitting, Cuff Size: Large)   Pulse 73   Temp 98.4 F (36.9 C) (Oral)   Ht 5' 4.75" (1.645 m)   Wt 154 lb 6.4 oz (70 kg)   LMP 01/17/2017   SpO2 98%   BMI 25.89 kg/m  Gen: NAD, resting comfortably HEENT: Mucous membranes are  moist. Oropharynx normal Neck: no thyromegaly, no lyphadenopathy CV: RRR 2/6 SEM LUSB but no rubs or gallops Lungs: CTAB no crackles, wheeze, rhonchi Abdomen: soft/nontender/nondistended/normal bowel sounds. No rebound or guarding.  Ext: no edema Skin: warm, dry Neuro: grossly normal, moves all extremities, PERRLA  Assessment/Plan:  46 y.o. female presenting for annual physical.  Health Maintenance counseling: 1. Anticipatory guidance: Patient counseled regarding regular dental exams q6 months, eye exams - going every 2 years- readers only, wearing seatbelts.  2. Risk factor reduction:  Advised patient of need for regular exercise and diet rich and fruits and vegetables to reduce risk of heart attack and stroke. Exercise- trying to walk steps at work at least once a day- discussed regular exercise- 150 minutes a week  target. Diet-largely stable- reasonable diet despite loss of husband.  Wt Readings from Last 3 Encounters:  01/18/17 154 lb 6.4 oz (70 kg)  12/19/15 155 lb (70.3 kg)  08/08/15 152 lb (68.9 kg)  3. 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 Influenza-Unspecified 03/30/2015  . Td 06/14/1996  . Tdap 03/05/2014  4. Cervical cancer screening- Dr. Philis Pique yearly- last record shows 2015- will get update- may actually need repeat 5. Breast cancer screening-  breast exam with Dr. Philis Pique and mammogram 06/16/16 reassuring with 1 year follow up 6. Colon cancer screening - no family history, start at age 50. Discussed guidelines potentially changing- will reassess next year- consider at age 38 7. Skin cancer screening- Dr. Martinique yearly  Status of chronic or acute concerns   Vitamin D supplementation per GYN along with calcium- wants to make sure vitamin D not too high or low. Takes intermittently likely 1000 IU  Lost husband October 2017- seeing grief counselor with hospice- overall doing ok with all things considered  Migraines- still about once a month- still with aura  Family history of MI with chest pain last year- did stress test due to strong family history and low risk- chest pain has resolved  Pure hypercholesterolemia Will update lipids and consider 5% cut off per ascvd calculator. Consider aspirin age 11.   1 year CPE  Orders Placed This Encounter  Procedures  . CBC    [place]  . Comprehensive metabolic panel    [place]    Order Specific Question:   Has the patient fasted?    Answer:   No  . Lipid panel    [place]    Order Specific Question:   Has the patient fasted?    Answer:   No  . VITAMIN D 25 Hydroxy (Vit-D Deficiency, Fractures)    [place]   Return precautions advised.  [name], MD

## 2017-01-20 ENCOUNTER — Encounter: Payer: Self-pay | Admitting: Family Medicine

## 2017-06-24 ENCOUNTER — Ambulatory Visit: Payer: Self-pay | Admitting: *Deleted

## 2017-06-24 NOTE — Telephone Encounter (Signed)
  Reason for Disposition . [1] MODERATE dizziness (e.g., vertigo; feels very unsteady, interferes with normal activities) AND [2] has NOT been evaluated by physician for this  Answer Assessment - Initial Assessment Questions 1. DESCRIPTION: "Describe your dizziness."     Lightheadedness just before cycle starts 2. LIGHTHEADED: "Do you feel lightheaded?" (e.g., somewhat faint, woozy, weak upon standing)     Yes, also with sitting and lying 3. VERTIGO: "Do you feel like either you or the room is spinning or tilting?" (i.e. vertigo)     Yes, room spinning. 4. SEVERITY: "How bad is it?"  "Do you feel like you are going to faint?" "Can you stand and walk?"   - MILD - walking normally   - MODERATE - interferes with normal activities (e.g., work, school)    - SEVERE - unable to stand, requires support to walk, feels like passing out now.     Just swimmy headed 5. ONSET:  "When did the dizziness begin?"    Last month just before cycle lasted 3-4 days, This month just before her period started and lasted about a week. 6. AGGRAVATING FACTORS: "Does anything make it worse?" (e.g., standing, change in head position)     Noticed the dizziness when she goes from sitting to standing, and while lying at night, it just comes on. 7. HEART RATE: "Can you tell me your heart rate?" "How many beats in 15 seconds?"  (Note: not all patients can do this)     120/80 b/p and 60 bpm-last night checking after a dizzy spell passed.  8. CAUSE: "What do you think is causing the dizziness?"     Maybe blood pressure 9. RECURRENT SYMPTOM: "Have you had dizziness before?" If so, ask: "When was the last time?" "What happened that time?"    no 10. OTHER SYMPTOMS: "Do you have any other symptoms?" (e.g., fever, chest pain, vomiting, diarrhea, bleeding)      no 11. PREGNANCY: "Is there any chance you are pregnant?" "When was your last menstrual period?"    no  Protocols used: DIZZINESS - VERTIGO-A-AH, [place]

## 2017-06-27 ENCOUNTER — Ambulatory Visit: Payer: Commercial Managed Care - HMO | Admitting: Family Medicine

## 2017-06-27 ENCOUNTER — Encounter: Payer: Self-pay | Admitting: Family Medicine

## 2017-06-27 VITALS — BP 128/70 | HR 67 | Temp 98.7°F | Ht 64.75 in | Wt 158.6 lb

## 2017-06-27 DIAGNOSIS — H8111 Benign paroxysmal vertigo, right ear: Secondary | ICD-10-CM | POA: Insufficient documentation

## 2017-06-27 MED ORDER — MECLIZINE HCL 25 MG PO TABS
25.0000 mg | ORAL_TABLET | Freq: Three times a day (TID) | ORAL | 0 refills | Status: DC | PRN
Start: 2017-06-27 — End: 2018-11-13

## 2017-06-27 NOTE — Progress Notes (Signed)
Subjective:  Shelly Goodwin is a 47 y.o. year old very pleasant female patient who presents for/with See problem oriented charting ROS- No facial or extremity weakness. No slurred words or trouble swallowing. no blurry vision or double vision. No paresthesias. No confusion or word finding difficulties. No chest pain or shortness of breath.    Past Medical History-  Patient Active Problem List   Diagnosis Date Noted  . Family history of MI (myocardial infarction) 01/22/2015    Priority: High  . Migraine with aura 03/02/2013    Priority: Medium  . Pure hypercholesterolemia 03/02/2013    Priority: Medium  . History of depression 10/25/2007    Priority: Medium  . History of skin cancer 04/28/2016    Priority: Low   Medications- reviewed and updated, no rx  Objective: BP 128/70 (BP Location: Left Arm, Patient Position: Sitting, Cuff Size: Large)   Pulse 67   Temp 98.7 F (37.1 C) (Oral)   Ht 5' 4.75" (1.645 m)   Wt 158 lb 9.6 oz (71.9 kg)   LMP 06/19/2017   SpO2 98%   BMI 26.60 kg/m  Gen: NAD, resting comfortably CV: RRR no murmurs rubs or gallops Lungs: CTAB no crackles, wheeze, rhonchi Ext: no edema Skin: warm, dry Neuro: CN II-XII intact, sensation and reflexes normal throughout, 5/5 muscle strength in bilateral upper and lower extremities. Normal finger to nose. Normal rapid alternating movements. No pronator drift. Normal romberg. Normal gait.  dix hallpike positive to the right, slight nystagmus noted when done to the right  Assessment/Plan:  Benign paroxysmal positional vertigo of right ear S: started about a month ago- right before her period in December. Would get dizzy at work- would stand still and go away. Would lay down at night and then get better. When started period went away.   Started with dizziness around 1st week of January then period started a few days later. Dizziness tends to come and go- worse with laying down. Also noted trigger looking up then coming  back down putting a bulb in a light fixture.   Feels like the room is spinning. Closing eyes help. No hearing loss- other than had some right ear fullness and irrigation today improved symptoms. Never had anythign like this before. No lightheadendess with standing. No tinnitus. No palpitations. Work remains busy/stressful- not increased from baseline.   - has monitored home BPs over 3 days and from 112-126/64-79. HR from 61-75. With dizzy spell on Saturday BP was 126/79 and HR was 68.  A/P: 47 year old female with new onset vertigo with normal neuro exam and positive dix hallpike- very high probability BPPV. Will give home exercises, send in meclizine. If she is not improved by Thursday should contact us to refer to PT for formal testing.   Meds ordered this encounter  Medications  . meclizine (ANTIVERT) 25 MG tablet    Sig: Take 1 tablet (25 mg total) by mouth 3 (three) times daily as needed for dizziness.    Dispense:  30 tablet    Refill:  0    Return precautions advised.  [name], MD

## 2017-06-27 NOTE — Patient Instructions (Addendum)
Meclizine  Up to three times a day   Benign Positional Vertigo Vertigo is the feeling that you or your surroundings are moving when they are not. Benign positional vertigo is the most common form of vertigo. The cause of this condition is not serious (is benign). This condition is triggered by certain movements and positions (is positional). This condition can be dangerous if it occurs while you are doing something that could endanger you or others, such as driving. What are the causes? In many cases, the cause of this condition is not known. It may be caused by a disturbance in an area of the inner ear that helps your brain to sense movement and balance. This disturbance can be caused by a viral infection (labyrinthitis), head injury, or repetitive motion. What increases the risk? This condition is more likely to develop in:  Women.  People who are 52 years of age or older.  What are the signs or symptoms? Symptoms of this condition usually happen when you move your head or your eyes in different directions. Symptoms may start suddenly, and they usually last for less than a minute. Symptoms may include:  Loss of balance and falling.  Feeling like you are spinning or moving.  Feeling like your surroundings are spinning or moving.  Nausea and vomiting.  Blurred vision.  Dizziness.  Involuntary eye movement (nystagmus).  Symptoms can be mild and cause only slight annoyance, or they can be severe and interfere with daily life. Episodes of benign positional vertigo may return (recur) over time, and they may be triggered by certain movements. Symptoms may improve over time. How is this diagnosed? This condition is usually diagnosed by medical history and a physical exam of the head, neck, and ears. You may be referred to a health care provider who specializes in ear, nose, and throat (ENT) problems (otolaryngologist) or a provider who specializes in disorders of the nervous system  (neurologist). You may have additional testing, including:  MRI.  A CT scan.  Eye movement tests. Your health care provider may ask you to change positions quickly while he or she watches you for symptoms of benign positional vertigo, such as nystagmus. Eye movement may be tested with an electronystagmogram (ENG), caloric stimulation, the Dix-Hallpike test, or the roll test.  An electroencephalogram (EEG). This records electrical activity in your brain.  Hearing tests.  How is this treated? Usually, your health care provider will treat this by moving your head in specific positions to adjust your inner ear back to normal. Surgery may be needed in severe cases, but this is rare. In some cases, benign positional vertigo may resolve on its own in 2-4 weeks. Follow these instructions at home: Safety  Move slowly.Avoid sudden body or head movements.  Avoid driving.  Avoid operating heavy machinery.  Avoid doing any tasks that would be dangerous to you or others if a vertigo episode would occur.  If you have trouble walking or keeping your balance, try using a cane for stability. If you feel dizzy or unstable, sit down right away.  Return to your normal activities as told by your health care provider. Ask your health care provider what activities are safe for you. General instructions  Take over-the-counter and prescription medicines only as told by your health care provider.  Avoid certain positions or movements as told by your health care provider.  Drink enough fluid to keep your urine clear or pale yellow.  Keep all follow-up visits as told by your  health care provider. This is important. Contact a health care provider if:  You have a fever.  Your condition gets worse or you develop new symptoms.  Your family or friends notice any behavioral changes.  Your nausea or vomiting gets worse.  You have numbness or a "pins and needles" sensation. Get help right away if:  You  have difficulty speaking or moving.  You are always dizzy.  You faint.  You develop severe headaches.  You have weakness in your legs or arms.  You have changes in your hearing or vision.  You develop a stiff neck.  You develop sensitivity to light. This information is not intended to replace advice given to you by your health care provider. Make sure you discuss any questions you have with your health care provider. Document Released: 03/08/2006 Document Revised: 11/06/2015 Document Reviewed: 09/23/2014 Elsevier Interactive Patient Education  [employer].

## 2017-06-27 NOTE — Assessment & Plan Note (Signed)
S: started about a month ago- right before her period in December. Would get dizzy at work- would stand still and go away. Would lay down at night and then get better. When started period went away.   Started with dizziness around 1st week of January then period started a few days later. Dizziness tends to come and go- worse with laying down. Also noted trigger looking up then coming back down putting a bulb in a light fixture.   Feels like the room is spinning. Closing eyes help. No hearing loss- other than had some right ear fullness and irrigation today improved symptoms. Never had anythign like this before. No lightheadendess with standing. No tinnitus. No palpitations. Work remains busy/stressful- not increased from baseline.   - has monitored home BPs over 3 days and from 112-126/64-79. HR from 61-75. With dizzy spell on Saturday BP was 126/79 and HR was 68.  A/P: 47 year old female with new onset vertigo with normal neuro exam and positive dix hallpike- very high probability BPPV. Will give home exercises, send in meclizine. If she is not improved by Thursday should contact us to refer to PT for formal testing.

## 2017-08-03 LAB — HM PAP SMEAR: HM Pap smear: NEGATIVE

## 2017-08-04 LAB — HM MAMMOGRAPHY

## 2017-08-09 ENCOUNTER — Encounter: Payer: Self-pay | Admitting: Family Medicine

## 2017-08-17 ENCOUNTER — Encounter: Payer: Self-pay | Admitting: Family Medicine

## 2018-06-19 DIAGNOSIS — D0461 Carcinoma in situ of skin of right upper limb, including shoulder: Secondary | ICD-10-CM | POA: Diagnosis not present

## 2018-08-16 DIAGNOSIS — H52203 Unspecified astigmatism, bilateral: Secondary | ICD-10-CM | POA: Diagnosis not present

## 2018-08-16 DIAGNOSIS — H04123 Dry eye syndrome of bilateral lacrimal glands: Secondary | ICD-10-CM | POA: Diagnosis not present

## 2018-08-16 DIAGNOSIS — H524 Presbyopia: Secondary | ICD-10-CM | POA: Diagnosis not present

## 2018-09-15 ENCOUNTER — Encounter: Payer: Self-pay | Admitting: Family Medicine

## 2018-09-29 ENCOUNTER — Encounter: Payer: Self-pay | Admitting: Family Medicine

## 2018-11-13 ENCOUNTER — Ambulatory Visit (INDEPENDENT_AMBULATORY_CARE_PROVIDER_SITE_OTHER): Payer: BC Managed Care – PPO | Admitting: Family Medicine

## 2018-11-13 ENCOUNTER — Encounter: Payer: Self-pay | Admitting: Family Medicine

## 2018-11-13 ENCOUNTER — Other Ambulatory Visit: Payer: Self-pay

## 2018-11-13 VITALS — BP 112/70 | HR 77 | Temp 98.8°F | Ht 67.75 in | Wt 163.0 lb

## 2018-11-13 DIAGNOSIS — E559 Vitamin D deficiency, unspecified: Secondary | ICD-10-CM | POA: Diagnosis not present

## 2018-11-13 DIAGNOSIS — Z0000 Encounter for general adult medical examination without abnormal findings: Secondary | ICD-10-CM

## 2018-11-13 DIAGNOSIS — E7800 Pure hypercholesterolemia, unspecified: Secondary | ICD-10-CM | POA: Diagnosis not present

## 2018-11-13 LAB — CBC
HCT: 41.7 % (ref 36.0–46.0)
Hemoglobin: 14 g/dL (ref 12.0–15.0)
MCHC: 33.6 g/dL (ref 30.0–36.0)
MCV: 96.1 fl (ref 78.0–100.0)
Platelets: 167 10*3/uL (ref 150.0–400.0)
RBC: 4.33 Mil/uL (ref 3.87–5.11)
RDW: 14.1 % (ref 11.5–15.5)
WBC: 8.5 10*3/uL (ref 4.0–10.5)

## 2018-11-13 LAB — COMPREHENSIVE METABOLIC PANEL
ALT: 10 U/L (ref 0–35)
AST: 13 U/L (ref 0–37)
Albumin: 4.2 g/dL (ref 3.5–5.2)
Alkaline Phosphatase: 55 U/L (ref 39–117)
BUN: 8 mg/dL (ref 6–23)
CO2: 28 mEq/L (ref 19–32)
Calcium: 9.7 mg/dL (ref 8.4–10.5)
Chloride: 103 mEq/L (ref 96–112)
Creatinine, Ser: 0.75 mg/dL (ref 0.40–1.20)
GFR: 82.11 mL/min (ref >60.00)
Glucose, Bld: 87 mg/dL (ref 70–99)
Potassium: 4.1 mEq/L (ref 3.5–5.1)
Sodium: 139 mEq/L (ref 135–145)
Total Bilirubin: 0.5 mg/dL (ref 0.2–1.2)
Total Protein: 6.8 g/dL (ref 6.0–8.3)

## 2018-11-13 LAB — LIPID PANEL
Cholesterol: 208 mg/dL — ABNORMAL HIGH (ref 0–200)
HDL: 61.7 mg/dL (ref >39.00)
LDL Cholesterol: 133 mg/dL — ABNORMAL HIGH (ref 0–99)
NonHDL: 146.02
Total CHOL/HDL Ratio: 3
Triglycerides: 67 mg/dL (ref 0.0–149.0)
VLDL: 13.4 mg/dL (ref 0.0–40.0)

## 2018-11-13 LAB — VITAMIN D 25 HYDROXY (VIT D DEFICIENCY, FRACTURES): VITD: 31.73 ng/mL (ref 30.00–100.00)
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
## 2018-11-13 NOTE — Patient Instructions (Addendum)
Please stop by lab before you go If you do not have mychart- we will call you about results within 5 business days of [place] receiving them.  If you have mychart- we will send your results within 3 business days of [place] receiving them.  If abnormal or we want to clarify a result, we will call or mychart you to make sure you receive the message.  If you have questions or concerns or don't hear within 5-7 days, please send [place] a message or call us.    No other changes other than trying to restart your exercise

## 2018-12-26 DIAGNOSIS — C44519 Basal cell carcinoma of skin of other part of trunk: Secondary | ICD-10-CM | POA: Diagnosis not present

## 2018-12-26 DIAGNOSIS — D045 Carcinoma in situ of skin of trunk: Secondary | ICD-10-CM | POA: Diagnosis not present

## 2018-12-26 DIAGNOSIS — L821 Other seborrheic keratosis: Secondary | ICD-10-CM | POA: Diagnosis not present

## 2018-12-26 DIAGNOSIS — C44212 Basal cell carcinoma of skin of right ear and external auricular canal: Secondary | ICD-10-CM | POA: Diagnosis not present

## 2018-12-26 DIAGNOSIS — C44619 Basal cell carcinoma of skin of left upper limb, including shoulder: Secondary | ICD-10-CM | POA: Diagnosis not present

## 2018-12-26 DIAGNOSIS — Z85828 Personal history of other malignant neoplasm of skin: Secondary | ICD-10-CM | POA: Diagnosis not present

## 2018-12-26 DIAGNOSIS — C44719 Basal cell carcinoma of skin of left lower limb, including hip: Secondary | ICD-10-CM | POA: Diagnosis not present

## 2019-01-23 DIAGNOSIS — C44212 Basal cell carcinoma of skin of right ear and external auricular canal: Secondary | ICD-10-CM | POA: Diagnosis not present

## 2019-01-25 ENCOUNTER — Other Ambulatory Visit: Payer: Self-pay

## 2019-01-25 ENCOUNTER — Ambulatory Visit: Payer: BC Managed Care – PPO | Admitting: Family Medicine

## 2019-01-25 ENCOUNTER — Encounter: Payer: Self-pay | Admitting: Family Medicine

## 2019-01-25 VITALS — BP 120/80 | HR 66 | Temp 98.5°F | Ht 67.75 in | Wt 161.4 lb

## 2019-01-25 DIAGNOSIS — M722 Plantar fascial fibromatosis: Secondary | ICD-10-CM | POA: Diagnosis not present

## 2019-01-25 MED ORDER — MELOXICAM 7.5 MG PO TABS: 7.5000 mg | ORAL_TABLET | Freq: Every day | ORAL | 0 refills | Status: DC

## 2019-01-25 NOTE — Patient Instructions (Addendum)
Health Maintenance Due  Topic Date Due  . MAMMOGRAM - [facility], scheduled for 02/02/2019.  08/04/2018  . INFLUENZA VACCINE - let us know when you get this. We should have available next month if you want it here.  01/13/2019   Plantar fasciitis- see handoug  Meloxicam daily for 7-10 days  My hope is within 2-3 weeks you are at least 50% better- if not or just significantly bothering you can refer to sports medicine

## 2019-01-25 NOTE — Progress Notes (Signed)
Phone [phone]   Subjective:  Shelly Goodwin is a 48 y.o. year old very pleasant female patient who presents for/with See problem oriented charting Chief Complaint  Patient presents with   Foot Pain   ROS-no fever/chills/nausea/vomiting.  Has pain over left heel  Past Medical History-  Patient Active Problem List   Diagnosis Date Noted   Family history of MI (myocardial infarction) 01/22/2015    Priority: High   Migraine with aura 03/02/2013    Priority: Medium   Pure hypercholesterolemia 03/02/2013    Priority: Medium   History of depression 10/25/2007    Priority: Medium   History of skin cancer 04/28/2016    Priority: Low   Benign paroxysmal positional vertigo of right ear 06/27/2017    Medications- reviewed and updated Current Outpatient Medications  Medication Sig Dispense Refill   cholecalciferol (VITAMIN D3) 25 MCG (1000 UT) tablet Take 1,000 Units by mouth daily.     meloxicam (MOBIC) 7.5 MG tablet Take 1 tablet (7.5 mg total) by mouth daily. 10 tablet 0   No current facility-administered medications for this visit.      Objective:  BP 120/80 (BP Location: Left Arm, Patient Position: Sitting, Cuff Size: Normal)    Pulse 66    Temp 98.5 F (36.9 C) (Oral)    Ht 5' 7.75" (1.721 m)    Wt 161 lb 6.4 oz (73.2 kg)    SpO2 99%    BMI 24.72 kg/m  Gen: NAD, resting comfortably CV: RRR no murmurs rubs or gallops Lungs: CTAB no crackles, wheeze, rhonchi Skin: warm, dry with no edema MSK: Good motion and strength of the foot.  Has pinpoint pain over insertion point of plantar fascia on left heel.  No warmth or erythema noted.  Palpated remainder of foot without any pain noted    Assessment and Plan   #Left heel Pain S:C/o right heel pain x 3 weeks. Worse when first getting up on the morning and prolonged standing. Almost feels like a bruise. Sx have been worsening over time. She has tried ice and shoe insert with minimal relief. Has also tried compression  sleeve with no relief.  Reports occasional throbbing sensation and occasional stinging. Like she has stepped on stone but has nonvisible bruise.  Sometimes pain will radiate into the back of the ankle. Denies swelling. No mechanism of injury. Has tried Tylenol with minimal relief.   No increased activity. No fall or injury. Not wearing heels. In house split level- bottom level most of her time- is on slab. Doesn't wear shoes there.  A/P: Plantar fasciitis of left heel.  Can continue gel heel inserts.  Recommended trying to stay off feet if possible- is going to be very difficult with her work.  Since she is on a hard slab most of the day at home-recommended wearing comfortable shoes at home- can purchase some indoor shoes.  We will trial 7 to 10-day course of meloxicam.  Warned of potential cardiac risk but think benefits outweigh risks even with strong family history of CAD.  Gave handout from the sports medicine patient advisor and reviewed exercises together with plan to do these 3-4 times a week for a month then once a week thereafter.  If she fails to improve by at least 50% in 2 to 3 weeks we discussed referral to Dr. Charlann Boxer of sports medicine for his expert opinion -Did report mild pain at times over medial portion of left heel- unable to reproduce pain today-still think  plantar fasciitis is most likely diagnosis   Recommended follow up: As needed for this acute issue  Lab/Order associations:   ICD-10-CM   1. Plantar fasciitis of left foot  M72.2    Meds ordered this encounter  Medications   meloxicam (MOBIC) 7.5 MG tablet    Sig: Take 1 tablet (7.5 mg total) by mouth daily.    Dispense:  10 tablet    Refill:  0   Return precautions advised.  [name], MD

## 2019-02-02 DIAGNOSIS — Z6827 Body mass index (BMI) 27.0-27.9, adult: Secondary | ICD-10-CM | POA: Diagnosis not present

## 2019-02-02 DIAGNOSIS — Z01419 Encounter for gynecological examination (general) (routine) without abnormal findings: Secondary | ICD-10-CM | POA: Diagnosis not present

## 2019-02-02 DIAGNOSIS — Z1231 Encounter for screening mammogram for malignant neoplasm of breast: Secondary | ICD-10-CM | POA: Diagnosis not present

## 2019-02-02 LAB — HM MAMMOGRAPHY

## 2019-02-13 ENCOUNTER — Encounter: Payer: Self-pay | Admitting: Family Medicine

## 2019-02-16 ENCOUNTER — Encounter: Payer: Self-pay | Admitting: Family Medicine

## 2019-03-07 ENCOUNTER — Ambulatory Visit (INDEPENDENT_AMBULATORY_CARE_PROVIDER_SITE_OTHER): Payer: BC Managed Care – PPO | Admitting: Physician Assistant

## 2019-03-07 ENCOUNTER — Encounter: Payer: Self-pay | Admitting: Physician Assistant

## 2019-03-07 VITALS — Temp 98.7°F

## 2019-03-07 DIAGNOSIS — J019 Acute sinusitis, unspecified: Secondary | ICD-10-CM

## 2019-03-07 MED ORDER — AMOXICILLIN-POT CLAVULANATE 875-125 MG PO TABS: 1.0000 | ORAL_TABLET | Freq: Two times a day (BID) | ORAL | 0 refills | Status: DC

## 2019-03-07 NOTE — Progress Notes (Signed)
Virtual Visit via Video   I connected with Shelly Goodwin on 03/07/19 at 11:40 AM EDT by a video enabled telemedicine application and verified that I am speaking with the correct person using two identifiers. Location patient: Home Location provider:  [facility], Office Persons participating in the virtual visit: Shelly Goodwin, [employer] PA-C, Shelly Pickler, LPN   I discussed the limitations of evaluation and management by telemedicine and the availability of in person appointments. The patient expressed understanding and agreed to proceed.  I acted as a Education administrator for [employer], PA-C [employer], LPN  Subjective:   HPI:   Nasal congestion Called health at work on Sunday when her symptoms started, she was tested for COVID on Monday, and was told her negative result today.  She is planning to return to work tomorrow as her symptoms are slowly improving.  Pt c/o scratchy throat started Friday, on Saturday started with nasal congestion and had low grade fever 99.7 Saturday and Sunday. Also has cough expectorating green/yellow drainage. Pt said son has same symptoms and saw Dr. Jerline Goodwin yesterday and was given and antibiotic.  She denies: Fever, chills, diarrhea, loss of taste or smell, shortness of breath.  ROS: See pertinent positives and negatives per HPI.  Patient Active Problem List   Diagnosis Date Noted  . Benign paroxysmal positional vertigo of right ear 06/27/2017  . History of skin cancer 04/28/2016  . Family history of MI (myocardial infarction) 01/22/2015  . Migraine with aura 03/02/2013  . Pure hypercholesterolemia 03/02/2013  . History of depression 10/25/2007    Social History   Tobacco Use  . Smoking status: Never Smoker  . Smokeless tobacco: Never Used  Substance Use Topics  . Alcohol use: No    Current Outpatient Medications:  .  cholecalciferol (VITAMIN D3) 25 MCG (1000 UT) tablet, Take 1,000 Units by mouth daily., Disp: , Rfl:  .   amoxicillin-clavulanate (AUGMENTIN) 875-125 MG tablet, Take 1 tablet by mouth 2 (two) times daily., Disp: 20 tablet, Rfl: 0  Allergies  Allergen Reactions  . Sulfonamide Derivatives     REACTION: itiching    Objective:   VITALS: Per patient if applicable, see vitals. GENERAL: Alert, appears well and in no acute distress. HEENT: Atraumatic, conjunctiva clear, no obvious abnormalities on inspection of external nose and ears. NECK: Normal movements of the head and neck. CARDIOPULMONARY: No increased WOB. Speaking in clear sentences. I:E ratio WNL.  MS: Moves all visible extremities without noticeable abnormality. PSYCH: Pleasant and cooperative, well-groomed. Speech normal rate and rhythm. Affect is appropriate. Insight and judgement are appropriate. Attention is focused, linear, and appropriate.  NEURO: CN grossly intact. Oriented as arrived to appointment on time with no prompting. Moves both UE equally.  SKIN: No obvious lesions, wounds, erythema, or cyanosis noted on face or hands.  Assessment and Plan:   Shelly Goodwin was seen today for nasal congestion.  Diagnoses and all orders for this visit:  Acute non-recurrent sinusitis, unspecified location  Other orders -     amoxicillin-clavulanate (AUGMENTIN) 875-125 MG tablet; Take 1 tablet by mouth 2 (two) times daily.   No red flags on discussion with patient.  Will initiate augmentin per orders. Discussed taking medications as prescribed. Reviewed return precautions including worsening fever, SOB, worsening cough or other concerns. Push fluids and rest. I recommend that patient follow-up if symptoms worsen or persist despite treatment x 7-10 days, sooner if needed.   . Reviewed expectations re: course of current medical issues. . Discussed  self-management of symptoms. . Outlined signs and symptoms indicating need for more acute intervention. . Patient verbalized understanding and all questions were answered. Marland Kitchen Health Maintenance issues  including appropriate healthy diet, exercise, and smoking avoidance were discussed with patient. . See orders for this visit as documented in the electronic medical record.  I discussed the assessment and treatment plan with the patient. The patient was provided an opportunity to ask questions and all were answered. The patient agreed with the plan and demonstrated an understanding of the instructions.   The patient was advised to call back or seek an in-person evaluation if the symptoms worsen or if the condition fails to improve as anticipated.   CMA or LPN served as scribe during this visit. History, Physical, and Plan performed by medical provider. The above documentation has been reviewed and is accurate and complete.   [place], [place] 03/07/2019

## 2019-03-19 ENCOUNTER — Telehealth: Payer: Self-pay

## 2019-03-19 NOTE — Telephone Encounter (Signed)
Copied from [place] [id number]. Topic: Appointment Scheduling - Scheduling Inquiry for Clinic >> Mar 19, 2019 10:49 AM [name] wrote: Reason for CRM: pt states she has continued to have the dry cough, lingering sinus pressure and drainage issue, been going on since 9/19.  Pt has had a negative covid test, but wants to know if Dr Yong Channel wants to see her in office orwhat should she do now? It keeps coming back

## 2019-03-19 NOTE — Telephone Encounter (Signed)
Patient is scheduled for virtual appointment tomorrow at 4pm due to symptoms

## 2019-03-19 NOTE — Progress Notes (Signed)
Phone [phone]   Subjective:  Virtual visit via Video note. Chief complaint: Chief Complaint  Patient presents with  . Follow-up  . lingering sinus pressure    This visit type was conducted due to national recommendations for restrictions regarding the COVID-19 Pandemic (e.g. social distancing).  This format is felt to be most appropriate for this patient at this time balancing risks to patient and risks to population by having him in for in person visit.  No physical exam was performed (except for noted visual exam or audio findings with Telehealth visits).    Our team/I connected with Perrin Smack at  4:00 PM EDT by a video enabled telemedicine application (doxy.me or caregility through epic) and verified that I am speaking with the correct person using two identifiers.  Location patient: Home[place] Location provider: [facility], office Persons participating in the virtual visit:  patient  Our team/I discussed the limitations of evaluation and management by telemedicine and the availability of in person appointments. In light of current covid-19 pandemic, patient also understands that we are trying to protect them by minimizing in office contact if at all possible.  The patient expressed consent for telemedicine visit and agreed to proceed. Patient understands insurance will be billed.   ROS- no shortness of breath. No fever/chills. Does have sinus congestion   Past Medical History-  Patient Active Problem List   Diagnosis Date Noted  . Family history of MI (myocardial infarction) 01/22/2015    Priority: High  . Migraine with aura 03/02/2013    Priority: Medium  . Pure hypercholesterolemia 03/02/2013    Priority: Medium  . History of depression 10/25/2007    Priority: Medium  . History of skin cancer 04/28/2016    Priority: Low  . Benign paroxysmal positional vertigo of right ear 06/27/2017    Medications- reviewed and updated Current Outpatient Medications  Medication  Sig Dispense Refill  . cholecalciferol (VITAMIN D3) 25 MCG (1000 UT) tablet Take 1,000 Units by mouth daily.     No current facility-administered medications for this visit.      Objective:  Wt 155 lb (70.3 kg)   LMP 02/20/2019   BMI 23.74 kg/m  self reported vitals Gen: NAD, resting comfortably Lungs: nonlabored, normal respiratory rate  Skin: appears dry, no obvious rash    Assessment and Plan   Lingering congestion/patient concern for sinusitis S:Pt states she is having lingering congestion and a cough that started 09/19 with a lot sever nasal pressure and congestion and cough with low grade fever. She had covid test done came back neg. She thought she was getting better but it seems to be relapsing.  Son had simlar symptoms with negative covid 19 testand cleared with his antibiotic.Took 10 days of Augmentin through our clinic and then felt at least 50% improved.  Since that time will have good days and then will relapse.  Wonders if she pushed to get back to work too soon and did not give herself time to heal.   She has the following symptoms- 1.Congestion nasal- yellow.  2.  Sinus pressure- with headache 3.Dry cough 4.  PND.  Denies shortness of breath or chest pain A/P: Patient appears to have an on fully treated sinus infection-likely bacterial.  We will trial a course of doxycycline to get some atypical coverage since he was previously on Augmentin.  We will trial a 7-day course of prednisone.  If improving but not full resolution by 7 days could extend doxycycline an additional 7  days.  Alternatively if not improving could try 14-day course of Augmentin.  We discussed possible ENT referral after that-we could also consider Levaquin or sinus CT if not improving.  Recommended follow up: PRN for acute issue  Lab/Order associations:   ICD-10-CM   1. Bacterial sinusitis  J32.9    B96.89     Meds ordered this encounter  Medications  . predniSONE (DELTASONE) 20 MG tablet    Sig:  Take 2 pills for 3 days, 1 pill for 4 days    Dispense:  10 tablet    Refill:  0  . doxycycline (VIBRA-TABS) 100 MG tablet    Sig: Take 1 tablet (100 mg total) by mouth 2 (two) times daily for 7 days.    Dispense:  14 tablet    Refill:  0    Return precautions advised.  [name], MD

## 2019-03-19 NOTE — Telephone Encounter (Signed)
Noted  

## 2019-03-19 NOTE — Telephone Encounter (Signed)
No appt w/ Dr Yong Channel.  Please advise if ok to work in.

## 2019-03-20 ENCOUNTER — Encounter: Payer: Self-pay | Admitting: Family Medicine

## 2019-03-20 ENCOUNTER — Ambulatory Visit (INDEPENDENT_AMBULATORY_CARE_PROVIDER_SITE_OTHER): Payer: BC Managed Care – PPO | Admitting: Family Medicine

## 2019-03-20 VITALS — Wt 155.0 lb

## 2019-03-20 DIAGNOSIS — J329 Chronic sinusitis, unspecified: Secondary | ICD-10-CM | POA: Diagnosis not present

## 2019-03-20 DIAGNOSIS — B9689 Other specified bacterial agents as the cause of diseases classified elsewhere: Secondary | ICD-10-CM

## 2019-03-20 MED ORDER — DOXYCYCLINE HYCLATE 100 MG PO TABS: 100.0000 mg | ORAL_TABLET | Freq: Two times a day (BID) | ORAL | 0 refills | Status: AC

## 2019-03-20 MED ORDER — PREDNISONE 20 MG PO TABS: ORAL_TABLET | ORAL | 0 refills | Status: DC

## 2019-03-20 NOTE — Patient Instructions (Addendum)
Health Maintenance Due  Topic Date Due  . INFLUENZA VACCINE -will get through [facility] 01/13/2019

## 2019-03-28 DIAGNOSIS — Z85828 Personal history of other malignant neoplasm of skin: Secondary | ICD-10-CM | POA: Diagnosis not present

## 2019-03-28 DIAGNOSIS — L570 Actinic keratosis: Secondary | ICD-10-CM | POA: Diagnosis not present

## 2019-03-28 DIAGNOSIS — D2372 Other benign neoplasm of skin of left lower limb, including hip: Secondary | ICD-10-CM | POA: Diagnosis not present

## 2019-04-05 ENCOUNTER — Encounter: Payer: Self-pay | Admitting: Family Medicine

## 2019-07-30 DIAGNOSIS — Z85828 Personal history of other malignant neoplasm of skin: Secondary | ICD-10-CM | POA: Diagnosis not present

## 2019-07-30 DIAGNOSIS — L570 Actinic keratosis: Secondary | ICD-10-CM | POA: Diagnosis not present

## 2019-07-30 DIAGNOSIS — L821 Other seborrheic keratosis: Secondary | ICD-10-CM | POA: Diagnosis not present

## 2019-07-30 DIAGNOSIS — C44319 Basal cell carcinoma of skin of other parts of face: Secondary | ICD-10-CM | POA: Diagnosis not present

## 2019-07-30 DIAGNOSIS — D2272 Melanocytic nevi of left lower limb, including hip: Secondary | ICD-10-CM | POA: Diagnosis not present

## 2019-07-30 DIAGNOSIS — D2271 Melanocytic nevi of right lower limb, including hip: Secondary | ICD-10-CM | POA: Diagnosis not present

## 2019-08-21 DIAGNOSIS — C44319 Basal cell carcinoma of skin of other parts of face: Secondary | ICD-10-CM | POA: Diagnosis not present

## 2019-10-29 DIAGNOSIS — H524 Presbyopia: Secondary | ICD-10-CM | POA: Diagnosis not present

## 2019-10-29 DIAGNOSIS — H04123 Dry eye syndrome of bilateral lacrimal glands: Secondary | ICD-10-CM | POA: Diagnosis not present

## 2019-10-29 DIAGNOSIS — H52203 Unspecified astigmatism, bilateral: Secondary | ICD-10-CM | POA: Diagnosis not present

## 2019-11-27 DIAGNOSIS — Z85828 Personal history of other malignant neoplasm of skin: Secondary | ICD-10-CM | POA: Diagnosis not present

## 2019-11-27 DIAGNOSIS — D1801 Hemangioma of skin and subcutaneous tissue: Secondary | ICD-10-CM | POA: Diagnosis not present

## 2019-11-27 DIAGNOSIS — D2372 Other benign neoplasm of skin of left lower limb, including hip: Secondary | ICD-10-CM | POA: Diagnosis not present

## 2019-11-27 DIAGNOSIS — L812 Freckles: Secondary | ICD-10-CM | POA: Diagnosis not present

## 2019-11-27 DIAGNOSIS — L570 Actinic keratosis: Secondary | ICD-10-CM | POA: Diagnosis not present

## 2020-04-15 DIAGNOSIS — L821 Other seborrheic keratosis: Secondary | ICD-10-CM | POA: Diagnosis not present

## 2020-04-15 DIAGNOSIS — D225 Melanocytic nevi of trunk: Secondary | ICD-10-CM | POA: Diagnosis not present

## 2020-04-15 DIAGNOSIS — L738 Other specified follicular disorders: Secondary | ICD-10-CM | POA: Diagnosis not present

## 2020-04-15 DIAGNOSIS — Z85828 Personal history of other malignant neoplasm of skin: Secondary | ICD-10-CM | POA: Diagnosis not present

## 2020-04-29 ENCOUNTER — Ambulatory Visit: Payer: BC Managed Care – PPO | Admitting: Family Medicine

## 2020-04-29 ENCOUNTER — Other Ambulatory Visit: Payer: Self-pay

## 2020-04-29 ENCOUNTER — Encounter: Payer: Self-pay | Admitting: Family Medicine

## 2020-04-29 VITALS — BP 124/72 | HR 69 | Temp 98.1°F | Resp 18 | Ht 68.0 in | Wt 161.8 lb

## 2020-04-29 DIAGNOSIS — G5601 Carpal tunnel syndrome, right upper limb: Secondary | ICD-10-CM | POA: Diagnosis not present

## 2020-04-29 NOTE — Patient Instructions (Addendum)
Health Maintenance Due  Topic Date Due  . Hepatitis C Screening - consider with next labs Never done  . COLONOSCOPY Has been scheduled.  Never done  . MAMMOGRAM Goes to her OB Dec 21. Please have them send [place] a copy 02/02/2020   This looks like carpal tunnel most likely.  Lets try a cock-up wrist splint at nighttime/with sleep.  If no improvement within 2 to 3 weeks please let me know.  We can also check a B12 and thyroid level.  If none of this is revealing and you continue to have symptoms or if you have worsening symptoms I would like to refer you to sports medicine for their opinion.  See exercises from sports medicine advisor- do these 3-5x a week for a month and then once a week or so. Stop anything that increases pain.   Recommended follow up: Return in about 1 month (around 05/29/2020) for follow up- or sooner if needed if fail to improve.

## 2020-04-29 NOTE — Progress Notes (Signed)
Phone [phone] In person visit   Subjective:   Shelly Goodwin is a 49 y.o. year old very pleasant female patient who presents for/with See problem oriented charting Chief Complaint  Patient presents with  . Hand numbness   This visit occurred during the SARS-CoV-2 public health emergency.  Safety protocols were in place, including screening questions prior to the visit, additional usage of staff PPE, and extensive cleaning of exam room while observing appropriate contact time as indicated for disinfecting solutions.   Past Medical History-  Patient Active Problem List   Diagnosis Date Noted  . Family history of MI (myocardial infarction) 01/22/2015    Priority: High  . Migraine with aura 03/02/2013    Priority: Medium  . Pure hypercholesterolemia 03/02/2013    Priority: Medium  . History of depression 10/25/2007    Priority: Medium  . History of skin cancer 04/28/2016    Priority: Low  . Benign paroxysmal positional vertigo of right ear 06/27/2017    Medications- reviewed and updated Current Outpatient Medications  Medication Sig Dispense Refill  . cholecalciferol (VITAMIN D3) 25 MCG (1000 UT) tablet Take 1,000 Units by mouth daily.     No current facility-administered medications for this visit.     Objective:  BP 124/72   Pulse 69   Temp 98.1 F (36.7 C) (Temporal)   Resp 18   Ht 5\' 8"  (1.727 m)   Wt 161 lb 12.8 oz (73.4 kg)   SpO2 98%   BMI 24.60 kg/m  Gen: NAD, resting comfortably CV: RRR no murmurs rubs or gallops Lungs: CTAB no crackles, wheeze, rhonchi Skin: warm, dry Neuro: good grip strength and normal gross touch MSK: Negative Spurling test.  Positive Tinel and Phalen test on the right side.    Assessment and Plan  # Right Hand Numbness  S:Patient mentioned that she noticed some numbness to her right hand a month ago. She is still able to use to her hand with no issues. She stated that it does come at random.  Most prominent in morning- has  tried to sleep on the left side and that has not helped- she usually sleeps on right side.  At first thought perhaps slept on it funny. If holds her hand up for a while feels it going to sleep. Can also hand with hands are in front of her sitting down watching tv. Can write without difficulty. Not dropping issues. No weakness. Perhaps mild tension in right upper back but no shoulder/neck pain. Feels through the whole hand.  No chest pain or shortness of breath.   No history of b12 or thyroid issues.  A/P:  This looks like carpal tunnel most likely.  Lets try a cock-up wrist splint at nighttime/with sleep.  If no improvement within 2 to 3 weeks please let me know.  We can also check a B12 and thyroid level.  If none of this is revealing and you continue to have symptoms or if you have worsening symptoms I would like to refer you to sports medicine for their opinion.   Recommended follow up: Return in about 1 month (around 05/29/2020) for follow up- or sooner if needed if fail to improve.  Lab/Order associations:   ICD-10-CM   1. Carpal tunnel syndrome on right  G56.01    Time Spent: 15 minutes of total time (1:50 PM- 2:15 PM ) was spent on the date of the encounter performing the following actions: chart review prior to seeing the patient, obtaining history,  performing a medically necessary exam, counseling on the treatment plan, placing orders, and documenting in our EHR.   Return precautions advised.  [name], MD

## 2020-05-06 DIAGNOSIS — J069 Acute upper respiratory infection, unspecified: Secondary | ICD-10-CM | POA: Diagnosis not present

## 2020-08-15 DIAGNOSIS — Z1231 Encounter for screening mammogram for malignant neoplasm of breast: Secondary | ICD-10-CM | POA: Diagnosis not present

## 2020-08-15 DIAGNOSIS — Z6827 Body mass index (BMI) 27.0-27.9, adult: Secondary | ICD-10-CM | POA: Diagnosis not present

## 2020-08-15 DIAGNOSIS — Z01419 Encounter for gynecological examination (general) (routine) without abnormal findings: Secondary | ICD-10-CM | POA: Diagnosis not present

## 2020-08-15 LAB — HM MAMMOGRAPHY

## 2020-10-29 DIAGNOSIS — H52203 Unspecified astigmatism, bilateral: Secondary | ICD-10-CM | POA: Diagnosis not present

## 2020-10-29 DIAGNOSIS — H04123 Dry eye syndrome of bilateral lacrimal glands: Secondary | ICD-10-CM | POA: Diagnosis not present

## 2020-12-03 DIAGNOSIS — D0462 Carcinoma in situ of skin of left upper limb, including shoulder: Secondary | ICD-10-CM | POA: Diagnosis not present

## 2020-12-03 DIAGNOSIS — Z85828 Personal history of other malignant neoplasm of skin: Secondary | ICD-10-CM | POA: Diagnosis not present

## 2020-12-03 DIAGNOSIS — L918 Other hypertrophic disorders of the skin: Secondary | ICD-10-CM | POA: Diagnosis not present

## 2020-12-03 DIAGNOSIS — L570 Actinic keratosis: Secondary | ICD-10-CM | POA: Diagnosis not present

## 2020-12-03 DIAGNOSIS — L814 Other melanin hyperpigmentation: Secondary | ICD-10-CM | POA: Diagnosis not present

## 2020-12-03 DIAGNOSIS — L309 Dermatitis, unspecified: Secondary | ICD-10-CM | POA: Diagnosis not present

## 2020-12-23 ENCOUNTER — Other Ambulatory Visit: Payer: Self-pay

## 2020-12-23 ENCOUNTER — Encounter: Payer: Self-pay | Admitting: Family Medicine

## 2020-12-23 ENCOUNTER — Ambulatory Visit (INDEPENDENT_AMBULATORY_CARE_PROVIDER_SITE_OTHER): Payer: BC Managed Care – PPO | Admitting: Family Medicine

## 2020-12-23 ENCOUNTER — Other Ambulatory Visit: Payer: Self-pay | Admitting: Family Medicine

## 2020-12-23 DIAGNOSIS — Z23 Encounter for immunization: Secondary | ICD-10-CM

## 2020-12-23 DIAGNOSIS — Z1159 Encounter for screening for other viral diseases: Secondary | ICD-10-CM

## 2020-12-23 DIAGNOSIS — E559 Vitamin D deficiency, unspecified: Secondary | ICD-10-CM

## 2020-12-23 DIAGNOSIS — Z0000 Encounter for general adult medical examination without abnormal findings: Secondary | ICD-10-CM | POA: Diagnosis not present

## 2020-12-23 DIAGNOSIS — E7800 Pure hypercholesterolemia, unspecified: Secondary | ICD-10-CM

## 2020-12-23 DIAGNOSIS — E785 Hyperlipidemia, unspecified: Secondary | ICD-10-CM

## 2020-12-23 DIAGNOSIS — Z1211 Encounter for screening for malignant neoplasm of colon: Secondary | ICD-10-CM

## 2020-12-23 DIAGNOSIS — Z8249 Family history of ischemic heart disease and other diseases of the circulatory system: Secondary | ICD-10-CM

## 2020-12-23 LAB — COMPREHENSIVE METABOLIC PANEL
ALT: 11 U/L (ref 0–35)
AST: 17 U/L (ref 0–37)
Albumin: 4.3 g/dL (ref 3.5–5.2)
Alkaline Phosphatase: 54 U/L (ref 39–117)
BUN: 13 mg/dL (ref 6–23)
CO2: 29 mEq/L (ref 19–32)
Calcium: 9.7 mg/dL (ref 8.4–10.5)
Chloride: 103 mEq/L (ref 96–112)
Creatinine, Ser: 0.82 mg/dL (ref 0.40–1.20)
GFR: 82.96 mL/min (ref >60.00)
Glucose, Bld: 72 mg/dL (ref 70–99)
Potassium: 4 mEq/L (ref 3.5–5.1)
Sodium: 139 mEq/L (ref 135–145)
Total Bilirubin: 0.5 mg/dL (ref 0.2–1.2)
Total Protein: 7.2 g/dL (ref 6.0–8.3)

## 2020-12-23 LAB — VITAMIN D 25 HYDROXY (VIT D DEFICIENCY, FRACTURES): VITD: 28.96 ng/mL — ABNORMAL LOW (ref 30.00–100.00)

## 2020-12-23 LAB — LIPID PANEL
Cholesterol: 203 mg/dL — ABNORMAL HIGH (ref 0–200)
HDL: 58.4 mg/dL (ref >39.00)
LDL Cholesterol: 127 mg/dL — ABNORMAL HIGH (ref 0–99)
NonHDL: 144.23
Total CHOL/HDL Ratio: 3
Triglycerides: 84 mg/dL (ref 0.0–149.0)
VLDL: 16.8 mg/dL (ref 0.0–40.0)

## 2020-12-23 MED ORDER — VITAMIN D (ERGOCALCIFEROL) 1.25 MG (50000 UNIT) PO CAPS: 50000.0000 [IU] | ORAL_CAPSULE | ORAL | 0 refills | Status: DC

## 2020-12-23 NOTE — Addendum Note (Signed)
Addended by: Loura Back on: 12/23/2020 04:02 PM   Modules accepted: Orders

## 2020-12-23 NOTE — Patient Instructions (Addendum)
Health Maintenance Due  Topic Date Due   COLONOSCOPY - We will call you within two weeks about your referral to GI. If you do not hear within 2 weeks, give [place] a call.    [facility] contact Address: [address], [place], [place] [address] Phone: [phone]  Never done   Zoster Vaccines- Shingrix (1 of 2) Discuss.  Shingrix #1 today. Repeat injection in 2-5 months. Schedule a nurse visit for the 2nd injection before you leave today (at the check out desk) Never done   MAMMOGRAM  AND PAP smear Sign release of information at the check out desk for records from Dr. Philis Pique 02/02/2020   Please stop by lab before you go If you have mychart- we will send your results within 3 business days of [place] receiving them.  If you do not have mychart- we will call you about results within 5 business days of [place] receiving them.  *please also note that you will see labs on mychart as soon as they post. I will later go in and write notes on them- will say "notes from Dr. Yong Channel"  Recommended follow up: Return in about 1 year (around 12/23/2021) for a physical .

## 2020-12-23 NOTE — Addendum Note (Signed)
Addended by: Linton Ham on: 12/23/2020 10:24 AM   Modules accepted: Orders

## 2020-12-23 NOTE — Progress Notes (Signed)
Phone [phone]   Subjective:  Patient presents today for their annual physical. Chief complaint-noted.   See problem oriented charting- ROS- full  review of systems was completed and negative except for: seasonal allergies, joint pain, back pain- nomajor changes  The following were reviewed and entered/updated in epic: Past Medical History:  Diagnosis Date   History of depression 10/25/2007   2005 husband diagnosed with kidney cancer (deals with metastasis issues still). Short term zoloft low dose.      Migraine with aura 03/02/2013   Stress as trigger. Floaters beforehand. Minimal Nausea. Usually treats with tylenol or nsaid and improves if catches early. 1x a month.   amitriptyline previously at 10mg  made her too sleepy    Pure hypercholesterolemia 03/02/2013   No rx needed. High HDL. LDL <130.  Lab Results  Component Value Date   CHOL 194 03/14/2014   HDL 57.00 03/14/2014   LDLCALC 126* 03/14/2014   LDLDIRECT 142.8 01/29/2013   TRIG 57.0 03/14/2014   CHOLHDL 3 03/14/2014        Patient Active Problem List   Diagnosis Date Noted   Family history of MI (myocardial infarction) 01/22/2015    Priority: High   Migraine with aura 03/02/2013    Priority: Medium   Pure hypercholesterolemia 03/02/2013    Priority: Medium   History of depression 10/25/2007    Priority: Medium   Benign paroxysmal positional vertigo of right ear 06/27/2017    Priority: Low   History of skin cancer 04/28/2016    Priority: Low   Past Surgical History:  Procedure Laterality Date   TUBAL LIGATION  2003   WISDOM TOOTH EXTRACTION      Family History  Problem Relation Age of Onset   Heart attack Mother 92       stent, nonsmoker   Hypertension Mother    Hyperlipidemia Mother    Heart attack Father 84       heavy smoker   Stroke Father        22s   Atrial fibrillation Father    Diabetes Father    Hyperlipidemia Father    Other Brother        overdose   Heart attack Brother 18        sudden-presumed Mi   Hypertension Brother    Heart attack Brother 56    Medications- reviewed and updated Current Outpatient Medications  Medication Sig Dispense Refill   cholecalciferol (VITAMIN D3) 25 MCG (1000 UT) tablet Take 1,000 Units by mouth daily.     No current facility-administered medications for this visit.    Allergies-reviewed and updated Allergies  Allergen Reactions   Sulfonamide Derivatives     REACTION: itiching    Social History   Social History Narrative   Widowed (husband passed kidney cancer oct 2017). 1 son- 62 in 2016.       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 Oropharynx normal Neck: no thyromegaly CV: RRR no murmurs rubs or gallops Lungs: CTAB no crackles, wheeze, rhonchi Abdomen: soft/nontender/nondistended/normal bowel sounds. No rebound or guarding.  Ext: no edema Skin: warm, dry Neuro: grossly normal,  moves all extremities, PERRLA   Assessment and Plan   50 y.o. female presenting for annual physical.  Health Maintenance counseling: 1. Anticipatory guidance: Patient counseled regarding regular dental exams -q6 months, eye exams -q2 years just in [place] wears readers, avoiding smoking and second hand smoke , limiting alcohol to 1 beverage per day .   2. Risk factor reduction:  Advised patient of need for regular exercise and diet rich and fruits and vegetables to reduce risk of heart attack and stroke. Exercise- had been doing well pre-COVID and she was planning to start back at the gym at [facility]- trying to get it in twice a week plus some at home . Diet-thought most issues were movement based in the past- maintains a reasonable diet- weight is overall still largely  stable.  Wt Readings from Last 3 Encounters:  12/23/20 161 lb 3.2 oz (73.1 kg)  04/29/20 161 lb 12.8 oz (73.4 kg)  03/20/19 155 lb (70.3 kg)  3. Immunizations/screenings/ancillary studies- Shingrix  and COVID-19 3 booster - had bad reaction to 2nd vaccine- had covid in January as well which should give a natural booster-   and Hepatitis C screening - opts in Immunization History  Administered Date(s) Administered   Influenza,inj,Quad PF,6+ Mos 04/01/2019   Influenza-Unspecified 03/14/2014, 03/30/2015, 03/01/2017, 03/13/2020   PFIZER(Purple Top)SARS-COV-2 Vaccination 06/27/2019, 07/17/2019   Td 06/14/1996   Tdap 03/05/2014   4. Cervical cancer screening- sees Dr. Philis Pique yearly- 08/03/17 last pap on record- has a 3-year repeat planned- overdue since 08/03/20 per records-we are going to try to update this - she had visit in march 5. Breast cancer screening-  breast exam with Dr. Philis Pique and mammogram 02/02/19 last on record and over due since 12/14/19- she had visit in march- will get records 6. Colon cancer screening - due, has never had this done- refer today 7. Skin cancer screening- Sees Dr. Martinique yearly- going q 6 months now- had squamous recently removed. advised regular sunscreen use. Denies worrisome, changing, or new skin lesions.  8. Birth control/STD check- declines- not sexually active 9. Osteoporosis screening at 25- will plan on this if Dr. Philis Pique doesn't do before that age -never smoker  Status of chronic or acute concerns   #Vitamin D deficiency S: Medication: vitamin D 25 mcg- 1000 units daily Last vitamin D Lab Results  Component Value Date   VD25OH 31.73 11/13/2018  A/P: hopefully stable- update vitamin D today. Continue current meds for now   #hyperlipidemia S: Medication: none  Lab Results  Component Value Date   CHOL 208 (H) 11/13/2018   HDL 61.70 11/13/2018   LDLCALC 133 (H) 11/13/2018   LDLDIRECT 142.8 01/29/2013   TRIG 67.0 11/13/2018   CHOLHDL 3 11/13/2018    A/P: 10-year ASCVD risk under 2% based on last labs-update lipid panel-likely continue without medicine-   #Family history of MI with chest pain 2017- did perform a stress test due to strong family history and low risk- chest pain has not recurred-we discussed possible coronary artery calcium scoring perhaps if reaches 5% threshold   #Migraines- still with aura- better recently - believes this is correlated this with menstruation in the past. Still occurs randomly recently- more stress triggers  #Allergies- takes allegra D sparingly- made her feel really fatigued (rare now). If allegra is taken alone she gets little benefit in the past- better lately  #Vertigo recurrence- patient reports - no recent recurrence  #right hip pain if laying on that side- dull pain- turning over  helps. Tylenol helps . Hip exam normal- seems to be low back related- discussed SM referral if not improving or worsens- she will let us know  Recommended follow up: Return in about 1 year (around 12/23/2021) for a physical .  Lab/Order associations:NOT fasting   ICD-10-CM   1. Preventative health care  Z00.00     2. Family history of MI (myocardial infarction)  Z82.49     3. Hyperlipidemia, unspecified hyperlipidemia type  E78.5     4. Pure hypercholesterolemia  E78.00     5. Vitamin D deficiency  E55.9     6. Encounter for hepatitis C screening test for low risk patient  Z11.59     7. Screen for colon cancer  Z12.11      I,Harris Phan,acting as a scribe for [name], MD.,have documented all relevant documentation on the behalf of [name], MD,as directed by  [name], MD while in the presence of [name], MD.  I, [name], MD, have reviewed all documentation for this visit. The documentation on 12/23/20 for the exam, diagnosis, procedures, and orders are all accurate and complete.   Return precautions advised.  [name], MD

## 2020-12-24 ENCOUNTER — Encounter: Payer: Self-pay | Admitting: Family Medicine

## 2020-12-24 LAB — HEPATITIS C ANTIBODY
Hepatitis C Ab: NONREACTIVE
SIGNAL TO CUT-OFF: 0.01 (ref <1.00)

## 2020-12-25 LAB — CBC WITH DIFFERENTIAL/PLATELET
Basophils Absolute: 0.1 10*3/uL (ref 0.0–0.1)
Basophils Relative: 0.8 % (ref 0.0–3.0)
Eosinophils Absolute: 0.2 10*3/uL (ref 0.0–0.7)
Eosinophils Relative: 2.4 % (ref 0.0–5.0)
HCT: 39.1 % (ref 36.0–46.0)
Hemoglobin: 13.2 g/dL (ref 12.0–15.0)
Lymphocytes Relative: 23.1 % (ref 12.0–46.0)
Lymphs Abs: 2.2 10*3/uL (ref 0.7–4.0)
MCHC: 33.7 g/dL (ref 30.0–36.0)
MCV: 93.1 fl (ref 78.0–100.0)
Monocytes Absolute: 0.7 10*3/uL (ref 0.1–1.0)
Monocytes Relative: 7.2 % (ref 3.0–12.0)
Neutro Abs: 6.3 10*3/uL (ref 1.4–7.7)
Neutrophils Relative %: 66.5 % (ref 43.0–77.0)
Platelets: 207 10*3/uL (ref 150.0–400.0)
RBC: 4.2 Mil/uL (ref 3.87–5.11)
RDW: 13.3 % (ref 11.5–15.5)
WBC: 9.5 10*3/uL (ref 4.0–10.5)

## 2020-12-25 NOTE — Addendum Note (Signed)
Addended by: Loura Back on: 12/25/2020 12:27 PM   Modules accepted: Orders

## 2021-01-07 ENCOUNTER — Encounter: Payer: Self-pay | Admitting: Gastroenterology

## 2021-02-03 ENCOUNTER — Encounter: Payer: Self-pay | Admitting: Physician Assistant

## 2021-02-03 ENCOUNTER — Ambulatory Visit: Payer: BC Managed Care – PPO | Admitting: Physician Assistant

## 2021-02-03 ENCOUNTER — Other Ambulatory Visit: Payer: Self-pay

## 2021-02-03 VITALS — BP 110/60 | HR 70 | Temp 98.1°F | Ht 68.0 in | Wt 162.0 lb

## 2021-02-03 DIAGNOSIS — R238 Other skin changes: Secondary | ICD-10-CM | POA: Diagnosis not present

## 2021-02-03 MED ORDER — KETOCONAZOLE 2 % EX SHAM
1.0000 | MEDICATED_SHAMPOO | CUTANEOUS | 0 refills | Status: DC
Start: 2021-02-05 — End: 2022-12-15

## 2021-02-03 NOTE — Patient Instructions (Signed)
It was great to see you!  Let's trial nizoral shampoo to treat for possible seborrheic dermatitis  May also trial your allegra daily x 1-2 weeks  If no improvement or any worsening, please let us know!!  Take care,  Inda Coke PA-C

## 2021-02-03 NOTE — Progress Notes (Signed)
Shelly Goodwin is a 50 y.o. female here for a new problem.  I acted as a Education administrator for [employer], PA-C [employer], LPN   History of Present Illness:   Chief Complaint  Patient presents with   Rash     HPI  Rash Pt c/o dry itchy scalp last week, used Head and shoulders and  had a burning sensation after she washed her hair, while her hair dried. Had increased redness around hairline and neck, also had some splotchiness. She has a blotchy red rash all over, started on 'Sunday. She uses Cetaphil lotion daily. No changes in products or medications, no recent travel or woods exposure.  Feels like her skin is overall getting more sensitive with time.  Has established dermatologist, Dr. Jordan.   Past Medical History:  Diagnosis Date   History of depression 10/25/2007   2005 husband diagnosed with kidney cancer (deals with metastasis issues still). Short term zoloft low dose.      Migraine with aura 03/02/2013   Stress as trigger. Floaters beforehand. Minimal Nausea. Usually treats with tylenol or nsaid and improves if catches early. 1x a month.   amitriptyline previously at 10mg made her too sleepy    Pure hypercholesterolemia 03/02/2013   No rx needed. High HDL. LDL <130.  Lab Results  Component Value Date   CHOL 194 03/14/2014   HDL 57.00 03/14/2014   LDLCALC 126* 03/14/2014   LDLDIRECT 142.8 01/29/2013   TRIG 57.0 03/14/2014   CHOLHDL 3 03/14/2014          Social History   Tobacco Use   Smoking status: Never   Smokeless tobacco: Never  Substance Use Topics   Alcohol use: No   Drug use: No    Past Surgical History:  Procedure Laterality Date   TUBAL LIGATION  2003   WISDOM TOOTH EXTRACTION      Family History  Problem Relation Age of Onset   Heart attack Mother 68       stent, nonsmoker   Hypertension Mother    Hyperlipidemia Mother    Heart attack Father 38       heavy smoker   Stroke Father        70'$ s   Atrial fibrillation Father    Diabetes Father     Hyperlipidemia Father    Other Brother        overdose   Heart attack Brother 47       sudden-presumed Mi   Hypertension Brother    Heart attack Brother 84    Allergies  Allergen Reactions   Sulfonamide Derivatives     REACTION: itiching    Current Medications:   Current Outpatient Medications:    cholecalciferol (VITAMIN D3) 25 MCG (1000 UT) tablet, Take 1,000 Units by mouth daily., Disp: , Rfl:    [START ON 02/05/2021] ketoconazole (NIZORAL) 2 % shampoo, Apply 1 application topically 2 (two) times a week., Disp: 120 mL, Rfl: 0   Review of Systems:   ROS Negative unless otherwise specified per HPI.  Vitals:   Vitals:   02/03/21 1558  BP: 110/60  Pulse: 70  Temp: 98.1 F (36.7 C)  TempSrc: Temporal  SpO2: 97%  Weight: 162 lb (73.5 kg)  Height: '5\' 8"'$  (1.727 m)     Body mass index is 24.63 kg/m.  Physical Exam:   Physical Exam Constitutional:      Appearance: Normal appearance. She is well-developed.  HENT:     Head: Normocephalic and atraumatic.  Eyes:     General: Lids are normal.     Extraocular Movements: Extraocular movements intact.     Conjunctiva/sclera: Conjunctivae normal.  Pulmonary:     Effort: Pulmonary effort is normal.  Musculoskeletal:        General: Normal range of motion.     Cervical back: Normal range of motion and neck supple.  Skin:    General: Skin is warm and dry.     Comments: 2 x 1/2 cm well-defined erythematous patches to hairline at base of neck No lesions found throughout scalp  Neurological:     Mental Status: She is alert and oriented to person, place, and time.  Psychiatric:        Attention and Perception: Attention and perception normal.        Mood and Affect: Mood normal.        Behavior: Behavior normal.        Thought Content: Thought content normal.        Judgment: Judgment normal.    Assessment and Plan:   [name] was seen today for rash.  Diagnoses and all orders for this visit:  Scalp  irritation  Other orders -     ketoconazole (NIZORAL) 2 % shampoo; Apply 1 application topically 2 (two) times a week.  No red flags on exam Possible seborrheic dermatitis Will treat with nizoral shampoo Also trial daily allegra x 2 weeks If lack of improvement or worsening, will have her reach out to her dermatologist   CMA or LPN served as scribe during this visit. History, Physical, and Plan performed by medical provider. The above documentation has been reviewed and is accurate and complete.  Shelly Coke, PA-C

## 2021-02-26 ENCOUNTER — Other Ambulatory Visit: Payer: Self-pay

## 2021-02-26 ENCOUNTER — Ambulatory Visit (AMBULATORY_SURGERY_CENTER): Payer: BC Managed Care – PPO | Admitting: *Deleted

## 2021-02-26 VITALS — Ht 65.0 in | Wt 155.0 lb

## 2021-02-26 DIAGNOSIS — Z1211 Encounter for screening for malignant neoplasm of colon: Secondary | ICD-10-CM

## 2021-02-26 MED ORDER — PEG-KCL-NACL-NASULF-NA ASC-C 100 G PO SOLR
1.0000 | Freq: Once | ORAL | 0 refills | Status: AC
Start: 2021-02-26 — End: 2021-02-26

## 2021-02-26 NOTE — Progress Notes (Signed)
Pt verified name, DOB, address and insurance during PV today.  Pt mailed instruction packet of Emmi video, copy of consent form to read and not return, and instructions. MOVI  coupon mailed in packet. PV completed over the phone.  Pt encouraged to call with questions or issues.  My Chart instructions to pt as well    No egg or soy allergy known to patient  No issues known to pt with past sedation with any surgeries or procedures Patient denies ever being told they had issues or difficulty with intubation  No FH of Malignant Hyperthermia Pt is not on diet pills Pt is not on  home 02  Pt is not on blood thinners  Pt denies issues with constipation  No A fib or A flutter  EMMI video to pt or via [place] 19 guidelines implemented in PV today with Pt and RN   Pt is fully vaccinated  for Covid   MOVI Coupon given to pt in PV today , Code to Pharmacy and  NO PA's for preps discussed with pt In PV today  Discussed with pt there will be an out-of-pocket cost for prep and that varies from $0 to 70 +  dollars   Due to the COVID-19 pandemic we are asking patients to follow certain guidelines.  Pt aware of COVID protocols and [facility] guidelines

## 2021-03-03 ENCOUNTER — Encounter: Payer: Self-pay | Admitting: Gastroenterology

## 2021-03-12 ENCOUNTER — Encounter: Payer: Self-pay | Admitting: Family Medicine

## 2021-03-13 ENCOUNTER — Encounter: Payer: Self-pay | Admitting: Gastroenterology

## 2021-03-13 ENCOUNTER — Other Ambulatory Visit: Payer: Self-pay

## 2021-03-13 ENCOUNTER — Ambulatory Visit (AMBULATORY_SURGERY_CENTER): Payer: BC Managed Care – PPO | Admitting: Gastroenterology

## 2021-03-13 VITALS — BP 126/75 | HR 63 | Temp 98.9°F | Resp 19 | Ht 68.0 in | Wt 155.0 lb

## 2021-03-13 DIAGNOSIS — K529 Noninfective gastroenteritis and colitis, unspecified: Secondary | ICD-10-CM | POA: Diagnosis not present

## 2021-03-13 DIAGNOSIS — K5289 Other specified noninfective gastroenteritis and colitis: Secondary | ICD-10-CM | POA: Diagnosis not present

## 2021-03-13 DIAGNOSIS — Z1211 Encounter for screening for malignant neoplasm of colon: Secondary | ICD-10-CM | POA: Diagnosis not present

## 2021-03-13 DIAGNOSIS — D127 Benign neoplasm of rectosigmoid junction: Secondary | ICD-10-CM

## 2021-03-13 MED ORDER — SODIUM CHLORIDE 0.9 % IV SOLN: 500.0000 mL | Freq: Once | INTRAVENOUS | Status: DC

## 2021-03-13 NOTE — Progress Notes (Signed)
Pt Drowsy. VSS. To PACU, report to RN. No anesthetic complications noted.  

## 2021-03-13 NOTE — Op Note (Signed)
[facility] Patient Name: Shelly Goodwin Procedure Date: 03/13/2021 10:58 AM MRN: [id number] Endoscopist: Gerrit Heck , MD Age: 50 Referring MD:  Date of Birth: 06-Mar-1971 Gender: Female Account #: [id number] Procedure:                Colonoscopy Indications:              Screening for colorectal malignant neoplasm, This                            is the patient's first colonoscopy                           Family history notable for father and grandfather                            with benign polyps. Otherwise no known FHx of colon                            cancer and no current GI symptoms. Medicines:                Monitored Anesthesia Care Procedure:                Pre-Anesthesia Assessment:                           - Prior to the procedure, a History and Physical                            was performed, and patient medications and                            allergies were reviewed. The patient's tolerance of                            previous anesthesia was also reviewed. The risks                            and benefits of the procedure and the sedation                            options and risks were discussed with the patient.                            All questions were answered, and informed consent                            was obtained. Prior Anticoagulants: The patient has                            taken no previous anticoagulant or antiplatelet                            agents. ASA Grade Assessment: II - A patient with  mild systemic disease. After reviewing the risks                            and benefits, the patient was deemed in                            satisfactory condition to undergo the procedure.                           After obtaining informed consent, the colonoscope                            was passed under direct vision. Throughout the                            procedure, the patient's blood pressure,  pulse, and                            oxygen saturations were monitored continuously. The                            CF [id number] #[id number] was introduced through the anus                            and advanced to the the terminal ileum. The                            colonoscopy was performed without difficulty. The                            patient tolerated the procedure well. The quality                            of the bowel preparation was good. The terminal                            ileum, ileocecal valve, appendiceal orifice, and                            rectum were photographed. Scope In: 11:07:17 AM Scope Out: 11:22:09 AM Scope Withdrawal Time: 0 hours 11 minutes 33 seconds  Total Procedure Duration: 0 hours 14 minutes 52 seconds  Findings:                 The perianal and digital rectal examinations were                            normal.                           A localized area of mildly erythematous mucosa was                            found in the recto-sigmoid colon. Biopsies were  taken with a cold forceps for histology. Estimated                            blood loss was minimal.                           The exam was otherwise normal throughout the                            remainder of the colon.                           The terminal ileum appeared normal.                           The retroflexed view of the distal rectum and anal                            verge was normal and showed no anal or rectal                            abnormalities. Complications:            No immediate complications. Estimated Blood Loss:     Estimated blood loss was minimal. Impression:               - Localized area of mild, scattered erythematous                            mucosa noted in the recto-sigmoid colon. Biopsied.                            The remainder of the colon was otherwise normal                            appearing.                            - The examined portion of the ileum was normal.                           - The distal rectum and anal verge are normal on                            retroflexion view. Recommendation:           - Patient has a contact number available for                            emergencies. The signs and symptoms of potential                            delayed complications were discussed with the                            patient. Return to normal activities tomorrow.  Written discharge instructions were provided to the                            patient.                           - Resume previous diet.                           - Continue present medications.                           - Await pathology results.                           - Repeat colonoscopy in 10 years for screening                            purposes.                           - Return to GI office PRN. Gerrit Heck, MD 03/13/2021 11:30:19 AM

## 2021-03-13 NOTE — Progress Notes (Signed)
Called to room to assist during endoscopic procedure.  Patient ID and intended procedure confirmed with present staff. Received instructions for my participation in the procedure from the performing physician.  

## 2021-03-13 NOTE — Progress Notes (Signed)
Pt's states no medical or surgical changes since previsit or office visit. VS assessed by C.W 

## 2021-03-13 NOTE — Patient Instructions (Signed)
Await pathology results. No repeat colonoscopy for 10 years!!   YOU HAD AN ENDOSCOPIC PROCEDURE TODAY AT [facility]:   Refer to the procedure report that was given to you for any specific questions about what was found during the examination.  If the procedure report does not answer your questions, please call your gastroenterologist to clarify.  If you requested that your care partner not be given the details of your procedure findings, then the procedure report has been included in a sealed envelope for you to review at your convenience later.  YOU SHOULD EXPECT: Some feelings of bloating in the abdomen. Passage of more gas than usual.  Walking can help get rid of the air that was put into your GI tract during the procedure and reduce the bloating. If you had a lower endoscopy (such as a colonoscopy or flexible sigmoidoscopy) you may notice spotting of blood in your stool or on the toilet paper. If you underwent a bowel prep for your procedure, you may not have a normal bowel movement for a few days.  Please Note:  You might notice some irritation and congestion in your nose or some drainage.  This is from the oxygen used during your procedure.  There is no need for concern and it should clear up in a day or so.  SYMPTOMS TO REPORT IMMEDIATELY:  Following lower endoscopy (colonoscopy or flexible sigmoidoscopy):  Excessive amounts of blood in the stool  Significant tenderness or worsening of abdominal pains  Swelling of the abdomen that is new, acute  Fever of 100F or higher   For urgent or emergent issues, a gastroenterologist can be reached at any hour by calling [phone]. Do not use MyChart messaging for urgent concerns.    DIET:  We do recommend a small meal at first, but then you may proceed to your regular diet.  Drink plenty of fluids but you should avoid alcoholic beverages for 24 hours.  ACTIVITY:  You should plan to take it easy for the rest of today and  you should NOT DRIVE or use heavy machinery until tomorrow (because of the sedation medicines used during the test).    FOLLOW UP: Our staff will call the number listed on your records 48-72 hours following your procedure to check on you and address any questions or concerns that you may have regarding the information given to you following your procedure. If we do not reach you, we will leave a message.  We will attempt to reach you two times.  During this call, we will ask if you have developed any symptoms of COVID 19. If you develop any symptoms (ie: fever, flu-like symptoms, shortness of breath, cough etc.) before then, please call [phone].  If you test positive for Covid 19 in the 2 weeks post procedure, please call and report this information to [place].    If any biopsies were taken you will be contacted by phone or by letter within the next 1-3 weeks.  Please call us at [phone] if you have not heard about the biopsies in 3 weeks.    SIGNATURES/CONFIDENTIALITY: You and/or your care partner have signed paperwork which will be entered into your electronic medical record.  These signatures attest to the fact that that the information above on your After Visit Summary has been reviewed and is understood.  Full responsibility of the confidentiality of this discharge information lies with you and/or your care-partner.

## 2021-03-13 NOTE — Progress Notes (Signed)
GASTROENTEROLOGY PROCEDURE H&P NOTE   Primary Care Physician: Marin Olp, MD    Reason for Procedure:  Colon Cancer screening  Plan:    Colonoscopy  Patient is appropriate for endoscopic procedure(s) in the ambulatory ([place]) setting.  The nature of the procedure, as well as the risks, benefits, and alternatives were carefully and thoroughly reviewed with the patient. Ample time for discussion and questions allowed. The patient understood, was satisfied, and agreed to proceed.     HPI: Shelly Goodwin is a 50 y.o. female who presents for colonoscopy for routine Colon Cancer screening.  No active GI symptoms.  No known family history of colon cancer or related malignancy.  Patient is otherwise without complaints or active issues today.  Past Medical History:  Diagnosis Date   Allergy    History of depression 10/25/2007   2005 husband diagnosed with kidney cancer (deals with metastasis issues still). Short term zoloft low dose.      Migraine with aura 03/02/2013   Stress as trigger. Floaters beforehand. Minimal Nausea. Usually treats with tylenol or nsaid and improves if catches early. 1x a month.   amitriptyline previously at 10mg  made her too sleepy    Pure hypercholesterolemia 03/02/2013   No rx needed. High HDL. LDL <130.  Lab Results  Component Value Date   CHOL 194 03/14/2014   HDL 57.00 03/14/2014   LDLCALC 126* 03/14/2014   LDLDIRECT 142.8 01/29/2013   TRIG 57.0 03/14/2014   CHOLHDL 3 03/14/2014         Past Surgical History:  Procedure Laterality Date   TUBAL LIGATION  2003   WISDOM TOOTH EXTRACTION      Prior to Admission medications   Medication Sig Start Date End Date Taking? Authorizing Provider  cholecalciferol (VITAMIN D3) 25 MCG (1000 UT) tablet Take 1,000 Units by mouth daily.   Yes [provider]  ketoconazole (NIZORAL) 2 % shampoo Apply 1 application topically 2 (two) times a week. Patient not taking: No sig reported 02/05/21   Inda Coke, PA    Current Outpatient Medications  Medication Sig Dispense Refill   cholecalciferol (VITAMIN D3) 25 MCG (1000 UT) tablet Take 1,000 Units by mouth daily.     ketoconazole (NIZORAL) 2 % shampoo Apply 1 application topically 2 (two) times a week. (Patient not taking: No sig reported) 120 mL 0   Current Facility-Administered Medications  Medication Dose Route Frequency Provider Last Rate Last Admin   0.9 %  sodium chloride infusion  500 mL Intravenous Once Nakia Remmers V, DO        Allergies as of 03/13/2021 - Review Complete 03/13/2021  Allergen Reaction Noted   Sulfonamide derivatives  01/27/2007    Family History  Problem Relation Age of Onset   Heart attack Mother 71       stent, nonsmoker   Hypertension Mother    Hyperlipidemia Mother    Heart attack Father 53       heavy smoker   Stroke Father        54s   Atrial fibrillation Father    Diabetes Father    Hyperlipidemia Father    Other Brother        overdose   Heart attack Brother 38       sudden-presumed Mi   Hypertension Brother    Heart attack Brother 26   Colon polyps Paternal Grandfather    Colon cancer Neg Hx    Esophageal cancer Neg Hx  Stomach cancer Neg Hx    Rectal cancer Neg Hx     Social History   Socioeconomic History   Marital status: Widowed    Spouse name: Not on file   Number of children: Not on file   Years of education: Not on file   Highest education level: Not on file  Occupational History   Not on file  Tobacco Use   Smoking status: Never   Smokeless tobacco: Never  Substance and Sexual Activity   Alcohol use: No   Drug use: No   Sexual activity: Not on file  Other Topics Concern   Not on file  Social History Narrative   Widowed (husband passed kidney cancer oct 2017). 1 son- 81 in 2016.       Work as Management consultant at [employer]: reading, time with family   Social Determinants of Radio broadcast assistant Strain: Not on file  Food  Insecurity: Not on file  Transportation Needs: Not on file  Physical Activity: Not on file  Stress: Not on file  Social Connections: Not on file  Intimate Partner Violence: Not on file    Physical Exam: Vital signs in last 24 hours: @BP  136/87   Pulse 78   Temp 98.9 F (37.2 C) (Skin)   Resp 13   Ht 5\' 8"  (1.727 m)   Wt 155 lb (70.3 kg)   SpO2 100%   BMI 23.57 kg/m  GEN: NAD EYE: Sclerae anicteric ENT: MMM CV: Non-tachycardic Pulm: CTA b/l GI: Soft, NT/ND NEURO:  Alert & Oriented x [place], DO [facility]   03/13/2021 11:01 AM

## 2021-03-17 ENCOUNTER — Telehealth: Payer: Self-pay

## 2021-03-17 NOTE — Telephone Encounter (Signed)
  Follow up Call-  Call back number 03/13/2021  Post procedure Call Back phone  # [phone]  Permission to leave phone message Yes  Some recent data might be hidden     Patient questions:  Do you have a fever, pain , or abdominal swelling? No. Pain Score  0 *  Have you tolerated food without any problems? Yes.    Have you been able to return to your normal activities? Yes.    Do you have any questions about your discharge instructions: Diet   No. Medications  No. Follow up visit  No.  Do you have questions or concerns about your Care? No.  Actions: * If pain score is 4 or above: No action needed, pain <4.  Have you developed a fever since your procedure? no  2.   Have you had an respiratory symptoms (SOB or cough) since your procedure? no  3.   Have you tested positive for COVID 19 since your procedure no  4.   Have you had any family members/close contacts diagnosed with the COVID 19 since your procedure?  no   If yes to any of these questions please route to Joylene John, RN and Joella Prince, RN

## 2021-03-20 ENCOUNTER — Telehealth: Payer: Self-pay | Admitting: General Surgery

## 2021-03-20 NOTE — Telephone Encounter (Signed)
-----   Message from Celina, DO sent at 03/19/2021  3:33 PM EDT ----- Biopsies demonstrate mild active, chronic colitis (inflammation).  While early IBD could present this way, the differential diagnosis also includes medication effect, infection, and ischemia.  In the absence of GI symptoms, do not feel strongly that we need to start medication at this juncture.  Can schedule follow-up in the GI clinic if needed to discuss these results in further detail and whether or not medications, additional labs, or surveillance are indicated.

## 2021-03-20 NOTE — Telephone Encounter (Signed)
Message sent to patient's my chart

## 2021-03-24 DIAGNOSIS — C44722 Squamous cell carcinoma of skin of right lower limb, including hip: Secondary | ICD-10-CM | POA: Diagnosis not present

## 2021-04-01 ENCOUNTER — Ambulatory Visit (INDEPENDENT_AMBULATORY_CARE_PROVIDER_SITE_OTHER): Payer: BC Managed Care – PPO

## 2021-04-01 ENCOUNTER — Other Ambulatory Visit: Payer: Self-pay

## 2021-04-01 DIAGNOSIS — Z23 Encounter for immunization: Secondary | ICD-10-CM | POA: Diagnosis not present

## 2021-07-14 DIAGNOSIS — L821 Other seborrheic keratosis: Secondary | ICD-10-CM | POA: Diagnosis not present

## 2021-07-14 DIAGNOSIS — D0462 Carcinoma in situ of skin of left upper limb, including shoulder: Secondary | ICD-10-CM | POA: Diagnosis not present

## 2021-07-14 DIAGNOSIS — L570 Actinic keratosis: Secondary | ICD-10-CM | POA: Diagnosis not present

## 2021-07-14 DIAGNOSIS — C44519 Basal cell carcinoma of skin of other part of trunk: Secondary | ICD-10-CM | POA: Diagnosis not present

## 2021-07-20 DIAGNOSIS — L570 Actinic keratosis: Secondary | ICD-10-CM | POA: Diagnosis not present

## 2021-07-20 DIAGNOSIS — C44519 Basal cell carcinoma of skin of other part of trunk: Secondary | ICD-10-CM | POA: Diagnosis not present

## 2021-07-20 DIAGNOSIS — D0462 Carcinoma in situ of skin of left upper limb, including shoulder: Secondary | ICD-10-CM | POA: Diagnosis not present

## 2021-10-30 DIAGNOSIS — H04123 Dry eye syndrome of bilateral lacrimal glands: Secondary | ICD-10-CM | POA: Diagnosis not present

## 2021-10-30 DIAGNOSIS — H52203 Unspecified astigmatism, bilateral: Secondary | ICD-10-CM | POA: Diagnosis not present

## 2021-12-11 DIAGNOSIS — Z1231 Encounter for screening mammogram for malignant neoplasm of breast: Secondary | ICD-10-CM | POA: Diagnosis not present

## 2021-12-11 DIAGNOSIS — Z6827 Body mass index (BMI) 27.0-27.9, adult: Secondary | ICD-10-CM | POA: Diagnosis not present

## 2021-12-11 DIAGNOSIS — Z01419 Encounter for gynecological examination (general) (routine) without abnormal findings: Secondary | ICD-10-CM | POA: Diagnosis not present

## 2021-12-11 LAB — HM MAMMOGRAPHY

## 2022-01-18 DIAGNOSIS — D2372 Other benign neoplasm of skin of left lower limb, including hip: Secondary | ICD-10-CM | POA: Diagnosis not present

## 2022-01-18 DIAGNOSIS — Z85828 Personal history of other malignant neoplasm of skin: Secondary | ICD-10-CM | POA: Diagnosis not present

## 2022-01-18 DIAGNOSIS — L814 Other melanin hyperpigmentation: Secondary | ICD-10-CM | POA: Diagnosis not present

## 2022-01-18 DIAGNOSIS — D1801 Hemangioma of skin and subcutaneous tissue: Secondary | ICD-10-CM | POA: Diagnosis not present

## 2022-01-18 DIAGNOSIS — L570 Actinic keratosis: Secondary | ICD-10-CM | POA: Diagnosis not present

## 2022-01-30 DIAGNOSIS — J218 Acute bronchiolitis due to other specified organisms: Secondary | ICD-10-CM | POA: Diagnosis not present

## 2022-01-30 DIAGNOSIS — R059 Cough, unspecified: Secondary | ICD-10-CM | POA: Diagnosis not present

## 2022-01-30 DIAGNOSIS — R0981 Nasal congestion: Secondary | ICD-10-CM | POA: Diagnosis not present

## 2022-03-08 ENCOUNTER — Encounter: Payer: Self-pay | Admitting: *Deleted

## 2022-04-18 DIAGNOSIS — J019 Acute sinusitis, unspecified: Secondary | ICD-10-CM | POA: Diagnosis not present

## 2022-04-18 DIAGNOSIS — J069 Acute upper respiratory infection, unspecified: Secondary | ICD-10-CM | POA: Diagnosis not present

## 2022-05-27 ENCOUNTER — Encounter: Payer: Self-pay | Admitting: *Deleted

## 2022-06-01 ENCOUNTER — Encounter: Payer: Self-pay | Admitting: Family Medicine

## 2022-06-01 ENCOUNTER — Ambulatory Visit: Payer: BC Managed Care – PPO | Admitting: Family Medicine

## 2022-06-01 VITALS — BP 122/60 | HR 80 | Temp 98.0°F | Ht 68.0 in | Wt 164.8 lb

## 2022-06-01 DIAGNOSIS — R058 Other specified cough: Secondary | ICD-10-CM

## 2022-06-01 MED ORDER — BENZONATATE 100 MG PO CAPS: 100.0000 mg | ORAL_CAPSULE | Freq: Three times a day (TID) | ORAL | 0 refills | Status: DC | PRN

## 2022-06-01 MED ORDER — PREDNISONE 20 MG PO TABS: ORAL_TABLET | ORAL | 0 refills | Status: DC

## 2022-06-01 NOTE — Patient Instructions (Addendum)
Let us know date of flu shot  Cough/congestion for over 6 weeks now meets criteria for chronic cough.  I wonder if she has a post viral irritation/cough  syndrome and we opted to try prednisone to suppress this-this would also help if there was an allergic element to this.  We are going to use a lower dose and she could supplement with Flonase if she would like to.  Also want to try to suppress the cough with Tessalon 3 times a day as needed but could schedule. - We discussed possible chest x-ray with duration of cough but she wanted to try treatment first and she will let me know if she wants to do the chest x-ray at a later date  Recommended follow up: Return for as needed for new, worsening, persistent symptoms.

## 2022-06-01 NOTE — Progress Notes (Signed)
Phone [phone] In person visit   Subjective:   Shelly Goodwin is a 51 y.o. year old very pleasant female patient who presents for/with See problem oriented charting Chief Complaint  Patient presents with   Cough    Pt c/o post viral cough that has lingered for 2 weeks after URI the first of Nov. Once she finished antibitotics was fine but the cough has not gone away, has had negative covid tests.   Past Medical History-  Patient Active Problem List   Diagnosis Date Noted   Family history of MI (myocardial infarction) 01/22/2015    Priority: High   Migraine with aura 03/02/2013    Priority: Medium    Pure hypercholesterolemia 03/02/2013    Priority: Medium    History of depression 10/25/2007    Priority: Medium    Benign paroxysmal positional vertigo of right ear 06/27/2017    Priority: Low   History of skin cancer 04/28/2016    Priority: Low   Vitamin D deficiency 12/23/2020    Medications- reviewed and updated Current Outpatient Medications  Medication Sig Dispense Refill   benzonatate (TESSALON PERLES) 100 MG capsule Take 1 capsule (100 mg total) by mouth 3 (three) times daily as needed for cough. 30 capsule 0   cholecalciferol (VITAMIN D3) 25 MCG (1000 UT) tablet Take 1,000 Units by mouth daily.     predniSONE (DELTASONE) 20 MG tablet Take 1 tablet by mouth daily for 5 days, then 1/2 tablet daily for 2 days 6 tablet 0   ketoconazole (NIZORAL) 2 % shampoo Apply 1 application topically 2 (two) times a week. (Patient not taking: Reported on 06/01/2022) 120 mL 0   No current facility-administered medications for this visit.     Objective:  BP 122/60   Pulse 80   Temp 98 F (36.7 C)   Ht '5\' 8"'$  (1.727 m)   Wt 164 lb 12.8 oz (74.8 kg)   SpO2 98%   BMI 25.06 kg/m  Gen: NAD, resting comfortably Mild nasal turbinate edema with mild erythema and minimal discharge, no sinus tenderness, oropharynx largely normal CV: RRR no murmurs rubs or gallops Lungs: CTAB no  crackles, wheeze, rhonchi Abdomen: soft/nontender/nondistended/normal bowel sounds. No rebound or guarding.  Ext: no edema Skin: warm, dry    Assessment and Plan   # cough/congestion S:had URI first of November but with lingering cough since that time. Took antibiotics that were given on early November (started with augmentin but bothered stomach when usually doesn't- had to change to doxycycline for 10 days and finished mid November) by  urgent care (had spiked a fever and home covid test was negative- still was spiking over weekend so went in- at that point 4 days of symptoms- they tested again and negative ) . Mild nasal congestion- mostly clear was greenish. Clear or yellow sputum.    She has tendency to get lingering cough after infections- even tried decongestant which has helped in past but minimal relief. Wondered about allergies as trigger. Still getting into coughing fits over 6 weeks from first symptom. No shortness of breath or wheeze and no fever since first weekend. Cough did not significantly improve on doxycyline. A/P: Cough/congestion for over 6 weeks now meets criteria for chronic cough.  I wonder if she has a post viral irritation/cough  syndrome and we opted to try prednisone to suppress this-this would also help if there was an allergic element to this.  We are going to use a lower dose and she could  supplement with Flonase if she would like to.  Also want to try to suppress the cough with Tessalon 3 times a day as needed but could schedule. - We discussed possible chest x-ray with duration of cough but she wanted to try treatment first and she will let me know if she wants to do the chest x-ray at a later date  Recommended follow up: Return for as needed for new, worsening, persistent symptoms.   Lab/Order associations:   ICD-10-CM   1. Post-viral cough syndrome  R05.8       Meds ordered this encounter  Medications   predniSONE (DELTASONE) 20 MG tablet    Sig: Take 1  tablet by mouth daily for 5 days, then 1/2 tablet daily for 2 days    Dispense:  6 tablet    Refill:  0   benzonatate (TESSALON PERLES) 100 MG capsule    Sig: Take 1 capsule (100 mg total) by mouth 3 (three) times daily as needed for cough.    Dispense:  30 capsule    Refill:  0    Return precautions advised.  [name], MD

## 2022-06-11 DIAGNOSIS — R0981 Nasal congestion: Secondary | ICD-10-CM | POA: Diagnosis not present

## 2022-06-11 DIAGNOSIS — J111 Influenza due to unidentified influenza virus with other respiratory manifestations: Secondary | ICD-10-CM | POA: Diagnosis not present

## 2022-06-11 DIAGNOSIS — R051 Acute cough: Secondary | ICD-10-CM | POA: Diagnosis not present

## 2022-07-21 DIAGNOSIS — D2261 Melanocytic nevi of right upper limb, including shoulder: Secondary | ICD-10-CM | POA: Diagnosis not present

## 2022-07-21 DIAGNOSIS — Z85828 Personal history of other malignant neoplasm of skin: Secondary | ICD-10-CM | POA: Diagnosis not present

## 2022-07-21 DIAGNOSIS — L814 Other melanin hyperpigmentation: Secondary | ICD-10-CM | POA: Diagnosis not present

## 2022-07-21 DIAGNOSIS — L570 Actinic keratosis: Secondary | ICD-10-CM | POA: Diagnosis not present

## 2022-07-21 DIAGNOSIS — L821 Other seborrheic keratosis: Secondary | ICD-10-CM | POA: Diagnosis not present

## 2022-07-21 DIAGNOSIS — C44519 Basal cell carcinoma of skin of other part of trunk: Secondary | ICD-10-CM | POA: Diagnosis not present

## 2022-11-02 DIAGNOSIS — H524 Presbyopia: Secondary | ICD-10-CM | POA: Diagnosis not present

## 2022-11-02 DIAGNOSIS — H04123 Dry eye syndrome of bilateral lacrimal glands: Secondary | ICD-10-CM | POA: Diagnosis not present

## 2022-11-02 DIAGNOSIS — H52203 Unspecified astigmatism, bilateral: Secondary | ICD-10-CM | POA: Diagnosis not present

## 2022-12-15 ENCOUNTER — Ambulatory Visit: Payer: BC Managed Care – PPO | Admitting: Family Medicine

## 2022-12-15 ENCOUNTER — Encounter: Payer: Self-pay | Admitting: Family Medicine

## 2022-12-15 VITALS — BP 112/67 | HR 77 | Temp 98.7°F | Ht 66.0 in | Wt 164.8 lb

## 2022-12-15 DIAGNOSIS — J302 Other seasonal allergic rhinitis: Secondary | ICD-10-CM

## 2022-12-15 DIAGNOSIS — H60511 Acute actinic otitis externa, right ear: Secondary | ICD-10-CM | POA: Diagnosis not present

## 2022-12-15 MED ORDER — NEOMYCIN-POLYMYXIN-HC 3.5-10000-1 OT SOLN: 3.0000 [drp] | Freq: Three times a day (TID) | OTIC | 0 refills | Status: AC

## 2022-12-15 NOTE — Progress Notes (Signed)
Subjective  CC:  Chief Complaint  Patient presents with   Acute Visit    Rt ear pain that started yesterday, feels like pressure , or water in her ear , some sore throat on right side, no fever or headache,  having  sharp pain  that comes and goes.    Same day acute visit; PCP not available. New pt to me. Chart reviewed.   HPI: Shelly Goodwin is a 52 y.o. female who presents to the office today to address the problems listed above in the chief complaint. Pleasant 52 year old female mostly healthy complains of right ear pain pressure.  Started yesterday, intermittent pressure but now having some sharp pain that comes and goes.  Some radiation into the throat.  No neck pain, fevers, trauma, ear discharge.  She does have some chronic seasonal allergies that are active.  No fevers or chills.  No cough.  Does not feel sick.  No myalgias.  Hearing is slightly muffled.  She does have a history of cerumen impaction.  Assessment  1. Acute actinic otitis externa of right ear      Plan  Otitis externa right: Very early with erythematous external auditory canal without swelling.  Treat with Cortisporin otic solution 3 times daily.  Advil or Tylenol and monitor.  Follow-up if worsening Seasonal allergies: Over-the-counter medications if needed  Follow up: As needed Visit date not found  No orders of the defined types were placed in this encounter.  Meds ordered this encounter  Medications   neomycin-polymyxin-hydrocortisone (CORTISPORIN) OTIC solution    Sig: Place 3 drops into the right ear 3 (three) times daily.    Dispense:  10 mL    Refill:  0      I reviewed the patients updated PMH, FH, and SocHx.    Patient Active Problem List   Diagnosis Date Noted   Vitamin D deficiency 12/23/2020   Benign paroxysmal positional vertigo of right ear 06/27/2017   History of skin cancer 04/28/2016   Family history of MI (myocardial infarction) 01/22/2015   Migraine with aura 03/02/2013   Pure  hypercholesterolemia 03/02/2013   History of depression 10/25/2007   Current Meds  Medication Sig   cholecalciferol (VITAMIN D3) 25 MCG (1000 UT) tablet Take 1,000 Units by mouth daily.   FYAVOLV 1-5 MG-MCG TABS tablet Take 1 tablet by mouth daily.   neomycin-polymyxin-hydrocortisone (CORTISPORIN) OTIC solution Place 3 drops into the right ear 3 (three) times daily.    Allergies: Patient is allergic to sulfonamide derivatives. Family History: Patient family history includes Atrial fibrillation in her father; Colon polyps in her paternal grandfather; Diabetes in her father; Heart attack (age of onset: 15) in her father; Heart attack (age of onset: 30) in her brother; Heart attack (age of onset: 70) in her brother; Heart attack (age of onset: 66) in her mother; Hyperlipidemia in her father and mother; Hypertension in her brother and mother; Other in her brother; Stroke in her father. Social History:  Patient  reports that she has never smoked. She has never used smokeless tobacco. She reports that she does not drink alcohol and does not use drugs.  Review of Systems: Constitutional: Negative for fever malaise or anorexia Cardiovascular: negative for chest pain Respiratory: negative for SOB or persistent cough Gastrointestinal: negative for abdominal pain  Objective  Vitals: BP 112/67   Pulse 77   Temp 98.7 F (37.1 C)   Ht 5\' 6"  (1.676 m)   Wt 164 lb 12.8 oz (  74.8 kg)   SpO2 94%   BMI 26.60 kg/m  General: no acute distress , A&Ox3 HEENT: PEERL, conjunctiva normal, neck is supple, right external auditory canal with erythema without edema, normal TM, no cerumen.  Left TM normal.  Oropharynx mildly erythematous Cardiovascular:  RRR without murmur or gallop.  Respiratory:  Good breath sounds bilaterally, CTAB with normal respiratory effort   Commons side effects, risks, benefits, and alternatives for medications and treatment plan prescribed today were discussed, and the patient  expressed understanding of the given instructions. Patient is instructed to call or message via MyChart if he/she has any questions or concerns regarding our treatment plan. No barriers to understanding were identified. We discussed Red Flag symptoms and signs in detail. Patient expressed understanding regarding what to do in case of urgent or emergency type symptoms.  Medication list was reconciled, printed and provided to the patient in AVS. Patient instructions and summary information was reviewed with the patient as documented in the AVS. This note was prepared with assistance of Dragon voice recognition software. Occasional wrong-word or sound-a-like substitutions may have occurred due to the inherent limitations of voice recognition software

## 2023-01-11 DIAGNOSIS — Z01419 Encounter for gynecological examination (general) (routine) without abnormal findings: Secondary | ICD-10-CM | POA: Diagnosis not present

## 2023-01-11 DIAGNOSIS — Z1231 Encounter for screening mammogram for malignant neoplasm of breast: Secondary | ICD-10-CM | POA: Diagnosis not present

## 2023-01-11 DIAGNOSIS — Z6827 Body mass index (BMI) 27.0-27.9, adult: Secondary | ICD-10-CM | POA: Diagnosis not present

## 2023-01-11 DIAGNOSIS — Z124 Encounter for screening for malignant neoplasm of cervix: Secondary | ICD-10-CM | POA: Diagnosis not present

## 2023-02-07 DIAGNOSIS — N950 Postmenopausal bleeding: Secondary | ICD-10-CM | POA: Diagnosis not present

## 2023-02-07 DIAGNOSIS — Z6828 Body mass index (BMI) 28.0-28.9, adult: Secondary | ICD-10-CM | POA: Diagnosis not present

## 2023-02-22 DIAGNOSIS — L814 Other melanin hyperpigmentation: Secondary | ICD-10-CM | POA: Diagnosis not present

## 2023-02-22 DIAGNOSIS — D1801 Hemangioma of skin and subcutaneous tissue: Secondary | ICD-10-CM | POA: Diagnosis not present

## 2023-02-22 DIAGNOSIS — L570 Actinic keratosis: Secondary | ICD-10-CM | POA: Diagnosis not present

## 2023-02-22 DIAGNOSIS — C44319 Basal cell carcinoma of skin of other parts of face: Secondary | ICD-10-CM | POA: Diagnosis not present

## 2023-02-22 DIAGNOSIS — L821 Other seborrheic keratosis: Secondary | ICD-10-CM | POA: Diagnosis not present

## 2023-02-22 DIAGNOSIS — C44729 Squamous cell carcinoma of skin of left lower limb, including hip: Secondary | ICD-10-CM | POA: Diagnosis not present

## 2023-02-22 DIAGNOSIS — Z85828 Personal history of other malignant neoplasm of skin: Secondary | ICD-10-CM | POA: Diagnosis not present

## 2023-02-22 DIAGNOSIS — C44519 Basal cell carcinoma of skin of other part of trunk: Secondary | ICD-10-CM | POA: Diagnosis not present

## 2023-03-01 ENCOUNTER — Encounter: Payer: Self-pay | Admitting: Family Medicine

## 2023-03-29 DIAGNOSIS — C44319 Basal cell carcinoma of skin of other parts of face: Secondary | ICD-10-CM | POA: Diagnosis not present

## 2023-10-10 DIAGNOSIS — L570 Actinic keratosis: Secondary | ICD-10-CM | POA: Diagnosis not present

## 2023-10-10 DIAGNOSIS — D2372 Other benign neoplasm of skin of left lower limb, including hip: Secondary | ICD-10-CM | POA: Diagnosis not present

## 2023-10-10 DIAGNOSIS — D485 Neoplasm of uncertain behavior of skin: Secondary | ICD-10-CM | POA: Diagnosis not present

## 2023-10-10 DIAGNOSIS — L309 Dermatitis, unspecified: Secondary | ICD-10-CM | POA: Diagnosis not present

## 2023-10-10 DIAGNOSIS — C44319 Basal cell carcinoma of skin of other parts of face: Secondary | ICD-10-CM | POA: Diagnosis not present

## 2023-10-10 DIAGNOSIS — Z85828 Personal history of other malignant neoplasm of skin: Secondary | ICD-10-CM | POA: Diagnosis not present

## 2023-10-10 DIAGNOSIS — L820 Inflamed seborrheic keratosis: Secondary | ICD-10-CM | POA: Diagnosis not present

## 2023-10-10 DIAGNOSIS — L430 Hypertrophic lichen planus: Secondary | ICD-10-CM | POA: Diagnosis not present

## 2023-11-11 DIAGNOSIS — H52203 Unspecified astigmatism, bilateral: Secondary | ICD-10-CM | POA: Diagnosis not present

## 2023-11-15 DIAGNOSIS — C44319 Basal cell carcinoma of skin of other parts of face: Secondary | ICD-10-CM | POA: Diagnosis not present

## 2024-01-16 ENCOUNTER — Ambulatory Visit: Payer: Self-pay

## 2024-01-16 NOTE — Telephone Encounter (Signed)
 FYI Only or Action Required?: FYI only for provider.  Patient was last seen in primary care on 12/15/2022 by Jodie Lavern CROME, MD.  Called Nurse Triage reporting Back Pain.  Symptoms began a week ago.  Interventions attempted: OTC medications: Aleve.  Symptoms are: gradually worsening.  Triage Disposition: See PCP When Office is Open (Within 3 Days)  Patient/caregiver understands and will follow disposition?: Yes    Copied from CRM [id number]. Topic: Clinical - Red Word Triage >> Jan 16, 2024 10:10 AM Avram MATSU wrote: Red Word that prompted transfer to Nurse Triage: lower back pain on right side sometimes feel the pain in hip. Reason for Disposition  [1] MODERATE back pain (e.g., interferes with normal activities) AND [2] present > 3 days  Answer Assessment - Initial Assessment Questions Patient states she has had pain here before but never received X-ray to rule out arthritis.Patient states she notices certain movements causes it to worsen.   1. ONSET: When did the pain begin? (e.g., minutes, hours, days)     A week ago  2. LOCATION: Where does it hurt? (upper, mid or lower back)     Right back pain and right hip  3. SEVERITY: How bad is the pain?  (e.g., Scale 1-10; mild, moderate, or severe)     5  4. PATTERN: Is the pain constant? (e.g., yes, no; constant, intermittent)      Comes and goes - hurts mostly if sitting and goes away after stretching and walking around  5. RADIATION: Does the pain shoot into your legs or somewhere else?     Radiates to right hip  6. CAUSE:  What do you think is causing the back pain?      Patient states I'm not sure if I twisted something  7. BACK OVERUSE:  Any recent lifting of heavy objects, strenuous work or exercise?     No  8. MEDICINES: What have you taken so far for the pain? (e.g., nothing, acetaminophen, NSAIDS)     Aleve  9. NEUROLOGIC SYMPTOMS: Do you have any weakness, numbness, or problems with  bowel/bladder control?     No  10. OTHER SYMPTOMS: Do you have any other symptoms? (e.g., fever, abdomen pain, burning with urination, blood in urine)       No  Protocols used: Back Pain-A-AH

## 2024-01-17 ENCOUNTER — Ambulatory Visit

## 2024-01-17 ENCOUNTER — Ambulatory Visit: Admitting: Family Medicine

## 2024-01-17 ENCOUNTER — Ambulatory Visit: Payer: Self-pay | Admitting: Family Medicine

## 2024-01-17 ENCOUNTER — Encounter: Payer: Self-pay | Admitting: Family Medicine

## 2024-01-17 VITALS — BP 136/86 | HR 71 | Ht 66.0 in | Wt 164.0 lb

## 2024-01-17 DIAGNOSIS — M545 Low back pain, unspecified: Secondary | ICD-10-CM

## 2024-01-17 DIAGNOSIS — M4317 Spondylolisthesis, lumbosacral region: Secondary | ICD-10-CM | POA: Diagnosis not present

## 2024-01-17 DIAGNOSIS — M47816 Spondylosis without myelopathy or radiculopathy, lumbar region: Secondary | ICD-10-CM | POA: Diagnosis not present

## 2024-01-17 DIAGNOSIS — E559 Vitamin D deficiency, unspecified: Secondary | ICD-10-CM | POA: Diagnosis not present

## 2024-01-17 DIAGNOSIS — E785 Hyperlipidemia, unspecified: Secondary | ICD-10-CM | POA: Diagnosis not present

## 2024-01-17 LAB — CBC WITH DIFFERENTIAL/PLATELET
Basophils Absolute: 0 K/uL (ref 0.0–0.1)
Basophils Relative: 0.6 % (ref 0.0–3.0)
Eosinophils Absolute: 0.1 K/uL (ref 0.0–0.7)
Eosinophils Relative: 0.9 % (ref 0.0–5.0)
HCT: 42.1 % (ref 36.0–46.0)
Hemoglobin: 13.9 g/dL (ref 12.0–15.0)
Lymphocytes Relative: 23.8 % (ref 12.0–46.0)
Lymphs Abs: 1.7 K/uL (ref 0.7–4.0)
MCHC: 33.1 g/dL (ref 30.0–36.0)
MCV: 94.5 fl (ref 78.0–100.0)
Monocytes Absolute: 0.4 K/uL (ref 0.1–1.0)
Monocytes Relative: 5.6 % (ref 3.0–12.0)
Neutro Abs: 5.1 K/uL (ref 1.4–7.7)
Neutrophils Relative %: 69.1 % (ref 43.0–77.0)
Platelets: 230 K/uL (ref 150.0–400.0)
RBC: 4.46 Mil/uL (ref 3.87–5.11)
RDW: 13.6 % (ref 11.5–15.5)
WBC: 7.3 K/uL (ref 4.0–10.5)

## 2024-01-17 LAB — LIPID PANEL
Cholesterol: 234 mg/dL — ABNORMAL HIGH (ref 0–200)
HDL: 56.7 mg/dL (ref >39.00)
LDL Cholesterol: 156 mg/dL — ABNORMAL HIGH (ref 0–99)
NonHDL: 177.38
Total CHOL/HDL Ratio: 4
Triglycerides: 105 mg/dL (ref 0.0–149.0)
VLDL: 21 mg/dL (ref 0.0–40.0)

## 2024-01-17 LAB — URINALYSIS, ROUTINE W REFLEX MICROSCOPIC
Bilirubin Urine: NEGATIVE
Ketones, ur: NEGATIVE
Nitrite: NEGATIVE
Specific Gravity, Urine: 1.025 (ref 1.000–1.030)
Total Protein, Urine: NEGATIVE
Urine Glucose: NEGATIVE
Urobilinogen, UA: 0.2 (ref 0.0–1.0)
pH: 6 (ref 5.0–8.0)

## 2024-01-17 LAB — COMPREHENSIVE METABOLIC PANEL WITH GFR
ALT: 12 U/L (ref 0–35)
AST: 15 U/L (ref 0–37)
Albumin: 4.4 g/dL (ref 3.5–5.2)
Alkaline Phosphatase: 47 U/L (ref 39–117)
BUN: 13 mg/dL (ref 6–23)
CO2: 29 meq/L (ref 19–32)
Calcium: 9.5 mg/dL (ref 8.4–10.5)
Chloride: 104 meq/L (ref 96–112)
Creatinine, Ser: 0.74 mg/dL (ref 0.40–1.20)
GFR: 91.83 mL/min (ref >60.00)
Glucose, Bld: 83 mg/dL (ref 70–99)
Potassium: 4.1 meq/L (ref 3.5–5.1)
Sodium: 138 meq/L (ref 135–145)
Total Bilirubin: 0.4 mg/dL (ref 0.2–1.2)
Total Protein: 7.4 g/dL (ref 6.0–8.3)

## 2024-01-17 LAB — VITAMIN D 25 HYDROXY (VIT D DEFICIENCY, FRACTURES): VITD: 24.67 ng/mL — ABNORMAL LOW (ref 30.00–100.00)

## 2024-01-17 MED ORDER — CYCLOBENZAPRINE HCL 5 MG PO TABS: 5.0000 mg | ORAL_TABLET | Freq: Every evening | ORAL | 0 refills | Status: AC | PRN

## 2024-01-17 NOTE — Progress Notes (Signed)
 Phone [phone] In person visit   Subjective:   Shelly Goodwin is a 53 y.o. year old very pleasant female patient who presents for/with See problem oriented charting Chief Complaint  Patient presents with   Back Pain    Lower back pain x 1.5 weeks, bilateral. Sx worse with lay to sit. More recently, sx more right sided, catching at times. Unable to lay on right side at bedtime. Has tried Tylenol, Aleve, stretching, heat. Radiating pain into the hip. Denies groin pain. Sx wax and wane. Denies n/t/w. Sx causing night disturbance. Tightness with stretching. No recent XR.      Past Medical History-  Patient Active Problem List   Diagnosis Date Noted   Family history of MI (myocardial infarction) 01/22/2015    Priority: High   Migraine with aura 03/02/2013    Priority: Medium    Pure hypercholesterolemia 03/02/2013    Priority: Medium    History of depression 10/25/2007    Priority: Medium    Benign paroxysmal positional vertigo of right ear 06/27/2017    Priority: Low   History of skin cancer 04/28/2016    Priority: Low   Vitamin D  deficiency 12/23/2020    Medications- reviewed and updated Current Outpatient Medications  Medication Sig Dispense Refill   cholecalciferol (VITAMIN D3) 25 MCG (1000 UT) tablet Take 1,000 Units by mouth daily.     cyclobenzaprine  (FLEXERIL ) 5 MG tablet Take 1 tablet (5 mg total) by mouth at bedtime as needed for muscle spasms. 20 tablet 0   FYAVOLV 1-5 MG-MCG TABS tablet Take 1 tablet by mouth daily.     neomycin -polymyxin-hydrocortisone (CORTISPORIN) OTIC solution Place 3 drops into the right ear 3 (three) times daily. 10 mL 0   No current facility-administered medications for this visit.     Objective:  BP 136/86   Pulse 71   Ht 5' 6 (1.676 m)   Wt 164 lb (74.4 kg)   SpO2 98%   BMI 26.47 kg/m  Gen: NAD, resting comfortably CV: RRR no murmurs rubs or gallops Lungs: CTAB no crackles, wheeze, rhonchi Abdomen:  soft/nontender/nondistended/normal bowel sounds. No rebound or guarding.  Ext: no edema Skin: warm, dry  Back - Normal skin, Spine with normal alignment and no deformity.  No tenderness to vertebral process palpation.  Paraspinous muscles tender and with spasm in the right low back but no issues in the left low back on exam.   Range of motion is full at neck and lumbar sacral regions. Negative Straight leg raise.  Neuro- no saddle anesthesia, 5/5 strength lower extremities, reflexes present bilaterally-more prominent on the left    Assessment and Plan    # Back Pain S: Patient reports bilateral low back pain and pain radiates into the right hip but no groin pain -Pain can wake her from sleep  -some intermittent back pain issues in past but never this bad or lasting this long -no fall or injury or twisting or lifting  Pain rating, quality, Location-symptoms wax and wane (not constant) but can be significant- up to 5/10  Started, duration-1.5 weeks Worse with with going from laying to sitting position, unable to lay on right side at bedtime due to pain, prolonged standing can trigger Previous Treatment-Tylenol, Aleve, stretching, heat Relieved by-heat probably the most  Previous imaging-none-only imaging with cervical spine in 2010  ROS-No saddle anesthesia, bladder incontinence, fecal incontinence, weakness in extremity, numbness or tingling in extremity. History negative for trauma, history of cancer, fever, chills, unintentional weight loss,  recent bacterial infection, recent IV drug use, HIV -no blood in stoolr or abdominal upset. No constipation of diarrhea A/P: Patient with intermittent back pain for years but this is one of the more severe episodes that she is having.  Red flag of it waking her up from sleep.  We will get x-rays for baseline but no midline pain noted thankfully.  Also update labs as been several years.  Offered physical therapy but wanted to try home exercise program  first-she may reach out for formal PT referral.  Trial Flexeril  as she does have some muscle spasm in the right low back-offered meloxicam  but she declines.  This could be degenerative disc disease but also could be simply back strain.  If we are not making progress within the next month her symptoms worsen will refer to sports medicine  -We will also use this opportunity to update her blood work including her lipid panel for mild hyperlipidemia and vitamin D  for vitamin D  deficiency  Recommended follow up: Return for as needed for new, worsening, persistent symptoms. No future appointments.   Lab/Order associations: NOT fasting   ICD-10-CM   1. Acute bilateral low back pain without sciatica  M54.50 Comprehensive metabolic panel with GFR    CBC with Differential/Platelet    Urinalysis, Routine w reflex microscopic    DG Lumbar Spine Complete    2. Vitamin D  deficiency  E55.9 VITAMIN D  25 Hydroxy (Vit-D Deficiency, Fractures)    3. Mild hyperlipidemia  E78.5 Lipid panel      Meds ordered this encounter  Medications   cyclobenzaprine  (FLEXERIL ) 5 MG tablet    Sig: Take 1 tablet (5 mg total) by mouth at bedtime as needed for muscle spasms.    Dispense:  20 tablet    Refill:  0   I personally spent a total of 30 minutes in the care of the patient today including preparing to see the patient, getting/reviewing separately obtained history, performing a medically appropriate exam/evaluation, counseling and educating, and placing orders.  Return precautions advised.  Garnette Lukes, MD

## 2024-01-17 NOTE — Patient Instructions (Addendum)
 Records release for Dr. Sarrah at [facility] for pap smear and mammogram  X-ray first  Please stop by lab before you go If you have mychart- we will send your results within 3 business days of us  receiving them.  If you do not have mychart- we will call you about results within 5 business days of us  receiving them.  *please also note that you will see labs on mychart as soon as they post. I will later go in and write notes on them- will say notes from Dr. Katrinka   Can trial flexeril  before bed. Can send in meloxicam  later if desired but wanted to hold off for now   Team please give her exercises for low back pain I want you to do the exercise 3x a week for a month then once a week for another month. Stop any exercise that causes more than 1-2/10 pain increase. If not doing better within 1-2 months let us  refer you to sports medicine or sooner if worsens  We may also do formal physical therapy if desired  Recommended follow up: Return for as needed for new, worsening, persistent symptoms.   TENS UNIT: This is helpful for muscle pain and spasm.   Search and Purchase a TENS 7000 2nd edition at  www.tenspros.com or www.[employer].com It should be less than $60    TENS unit instructions: Do not shower or bathe with the unit on Turn the unit off before removing electrodes or batteries If the electrodes lose stickiness add a drop of water to the electrodes after they are disconnected from the unit and place on plastic sheet. If you continued to have difficulty, call the TENS unit company to purchase more electrodes. Do not apply lotion on the skin area prior to use. Make sure the skin is clean and dry as this will help prolong the life of the electrodes. After use, always check skin for unusual red areas, rash or other skin difficulties. If there are any skin problems, does not apply electrodes to the same area. Never remove the electrodes from the unit by pulling the wires. Do not  use the TENS unit or electrodes other than as directed. Do not change electrode placement without consultating your therapist or physician. Keep 2 fingers with between each electrode. Wear time ratio is 2:1, on to off times.    For example on for 30 minutes off for 15 minutes and then on for 30 minutes off for 15 minutes

## 2024-01-19 NOTE — Telephone Encounter (Signed)
 See patient message below regarding Vit D. Xray has not been read yet. It looks like the urine culture was unable to be added. Want me to have her come in for a culture?

## 2024-01-24 ENCOUNTER — Other Ambulatory Visit: Payer: Self-pay | Admitting: Family Medicine

## 2024-01-24 DIAGNOSIS — N390 Urinary tract infection, site not specified: Secondary | ICD-10-CM

## 2024-01-24 NOTE — Telephone Encounter (Signed)
 Appt scheduled and sent to MyChart

## 2024-01-25 ENCOUNTER — Other Ambulatory Visit

## 2024-01-25 DIAGNOSIS — N390 Urinary tract infection, site not specified: Secondary | ICD-10-CM

## 2024-01-25 DIAGNOSIS — R319 Hematuria, unspecified: Secondary | ICD-10-CM | POA: Diagnosis not present

## 2024-01-26 LAB — URINE CULTURE
MICRO NUMBER:: 16826082
SPECIMEN QUALITY:: ADEQUATE

## 2024-01-27 ENCOUNTER — Ambulatory Visit: Payer: Self-pay | Admitting: Family Medicine

## 2024-03-14 DIAGNOSIS — Z1231 Encounter for screening mammogram for malignant neoplasm of breast: Secondary | ICD-10-CM | POA: Diagnosis not present

## 2024-03-14 DIAGNOSIS — Z01419 Encounter for gynecological examination (general) (routine) without abnormal findings: Secondary | ICD-10-CM | POA: Diagnosis not present
